# Patient Record
Sex: Female | Born: 1985 | Race: White | Hispanic: No | Marital: Single | Smoking: Former smoker
Health system: Southern US, Community
[De-identification: ages and names within clinical notes are randomized; demographics above are authoritative.]

## PROBLEM LIST (undated history)

## (undated) DIAGNOSIS — J302 Other seasonal allergic rhinitis: Secondary | ICD-10-CM

## (undated) DIAGNOSIS — N390 Urinary tract infection, site not specified: Secondary | ICD-10-CM

## (undated) DIAGNOSIS — B379 Candidiasis, unspecified: Secondary | ICD-10-CM

## (undated) HISTORY — PX: TONSILLECTOMY AND ADENOIDECTOMY: SHX28

## (undated) HISTORY — DX: Urinary tract infection, site not specified: N39.0

## (undated) HISTORY — DX: Candidiasis, unspecified: B37.9

---

## 2011-07-06 ENCOUNTER — Emergency Department: Payer: Self-pay | Admitting: Emergency Medicine

## 2011-07-06 LAB — URINALYSIS, COMPLETE
Ketone: NEGATIVE
Ph: 7 (ref 4.5–8.0)
Protein: NEGATIVE
RBC,UR: 2 /HPF (ref 0–5)
Squamous Epithelial: 14
WBC UR: 8 /HPF (ref 0–5)

## 2011-11-25 ENCOUNTER — Emergency Department: Payer: Self-pay | Admitting: Emergency Medicine

## 2014-03-16 ENCOUNTER — Emergency Department: Payer: Self-pay | Admitting: Emergency Medicine

## 2015-10-13 ENCOUNTER — Encounter: Payer: Self-pay | Admitting: Emergency Medicine

## 2015-10-13 ENCOUNTER — Emergency Department
Admission: EM | Admit: 2015-10-13 | Discharge: 2015-10-13 | Disposition: A | Discharge status: Home / Self Care | Payer: Self-pay | Attending: Emergency Medicine | Admitting: Emergency Medicine

## 2015-10-13 DIAGNOSIS — L02211 Cutaneous abscess of abdominal wall: Secondary | ICD-10-CM | POA: Insufficient documentation

## 2015-10-13 DIAGNOSIS — Z87891 Personal history of nicotine dependence: Secondary | ICD-10-CM | POA: Insufficient documentation

## 2015-10-13 DIAGNOSIS — H6982 Other specified disorders of Eustachian tube, left ear: Secondary | ICD-10-CM

## 2015-10-13 DIAGNOSIS — L0291 Cutaneous abscess, unspecified: Secondary | ICD-10-CM

## 2015-10-13 DIAGNOSIS — H6992 Unspecified Eustachian tube disorder, left ear: Secondary | ICD-10-CM | POA: Insufficient documentation

## 2015-10-13 HISTORY — DX: Other seasonal allergic rhinitis: J30.2

## 2015-10-13 MED ORDER — CETIRIZINE HCL 10 MG PO TABS: 10.0000 mg | ORAL_TABLET | Freq: Every day | ORAL | 0 refills | Status: AC

## 2015-10-13 MED ORDER — LIDOCAINE-EPINEPHRINE (PF) 1 %-1:200000 IJ SOLN
10.0000 mL | Freq: Once | INTRAMUSCULAR | Status: DC
  Filled 2015-10-13: qty 30

## 2015-10-13 MED ORDER — HYDROCODONE-ACETAMINOPHEN 5-325 MG PO TABS
1.0000 | ORAL_TABLET | ORAL | 0 refills | Status: DC | PRN
Start: 2015-10-13 — End: 2016-05-25

## 2015-10-13 MED ORDER — FLUTICASONE PROPIONATE 50 MCG/ACT NA SUSP: 1.0000 | Freq: Two times a day (BID) | NASAL | 0 refills | Status: AC

## 2015-10-13 MED ORDER — SULFAMETHOXAZOLE-TRIMETHOPRIM 800-160 MG PO TABS: 1.0000 | ORAL_TABLET | Freq: Two times a day (BID) | ORAL | 0 refills | Status: DC

## 2015-10-13 NOTE — ED Notes (Signed)
Pt states understanding of discharge instructions. NAD noted at this time.  

## 2015-10-13 NOTE — ED Triage Notes (Addendum)
Pt reports abscess to RLQ of her abdomen that came up last week. Pt states "I didn't have to work this weekend so I thought it would pop on it's own but I have to go to work tomorrow and it hasn't even been this bad before." Pt also reports difficulty hearing out of L ear, denies injury to L ear.

## 2015-10-13 NOTE — ED Provider Notes (Signed)
[facility] Emergency Department Provider Note  ____________________________________________  Time seen: Approximately 4:12 PM  I have reviewed the triage vital signs and the nursing notes.   HISTORY  Chief Complaint Abscess    HPI Rachel Parker is a 30 y.o. female who presents emergency department complaining of abscess to the right lower abdomen and left ear fullness. Patient states that she has had a boil to her right lower abdomen for almost a week. Patient thought it would rupture on its own states it has not. Patient reports redness and swelling to area. No drainage. Pain has been increasing.  Patient also endorses difficulty hearing out of the left ear with a full sensation to ear. Patient does report having seasonal allergies which have been worsening of the past several weeks. Patient denies any fevers or chills, sharp pain, recent swelling, drainage from ear.   Past Medical History:  Diagnosis Date  . Seasonal allergies     There are no active problems to display for this patient.   Past Surgical History:  Procedure Laterality Date  . TONSILLECTOMY AND ADENOIDECTOMY      Prior to Admission medications   Medication Sig Start Date End Date Taking? Authorizing Provider  cetirizine (ZYRTEC) 10 MG tablet Take 1 tablet (10 mg total) by mouth daily. 10/13/15   Delorise Royals Ameshia Pewitt, PA-C  fluticasone (FLONASE) 50 MCG/ACT nasal spray Place 1 spray into both nostrils 2 (two) times daily. 10/13/15   Delorise Royals Rocco Kerkhoff, PA-C  HYDROcodone-acetaminophen (NORCO/VICODIN) 5-325 MG tablet Take 1 tablet by mouth every 4 (four) hours as needed for moderate pain. 10/13/15   Delorise Royals Payeton Germani, PA-C  sulfamethoxazole-trimethoprim (BACTRIM DS,SEPTRA DS) 800-160 MG tablet Take 1 tablet by mouth 2 (two) times daily. 10/13/15   Delorise Royals Heber Hoog, PA-C    Allergies Review of patient's allergies indicates no known allergies.  History reviewed. No pertinent  family history.  Social History Social History  Substance Use Topics  . Smoking status: Former Games developer  . Smokeless tobacco: Never Used  . Alcohol use No     Comment: Occ     Review of Systems  Constitutional: No fever/chills Eyes: No visual changes. No discharge ENT: Is a for left ear fullness and decreased hearing the left ear Cardiovascular: no chest pain. Respiratory: no cough. No SOB. Gastrointestinal: No abdominal pain.  No nausea, no vomiting.  No diarrhea.  No constipation. Musculoskeletal: Negative for musculoskeletal pain. Skin: Positive for "boil" to right lower abdominal Neurological: Negative for headaches, focal weakness or numbness. 10-point ROS otherwise negative.  ____________________________________________   PHYSICAL EXAM:  VITAL SIGNS: ED Triage Vitals  Enc Vitals Group     BP 10/13/15 1531 122/79     Pulse Rate 10/13/15 1531 87     Resp 10/13/15 1531 18     Temp 10/13/15 1531 98.1 F (36.7 C)     Temp Source 10/13/15 1531 Oral     SpO2 10/13/15 1531 95 %     Weight 10/13/15 1527 210 lb (95.3 kg)     Height 10/13/15 1527 5\' 6"  (1.676 m)     Head Circumference --      Peak Flow --      Pain Score 10/13/15 1527 10     Pain Loc --      Pain Edu? --      Excl. in GC? --      Constitutional: Alert and oriented. Well appearing and in no acute distress. Eyes: Conjunctivae are normal. PERRL. EOMI.  Head: Atraumatic. ENT:      Ears: EACs unremarkable bilaterally. TM on right is unremarkable. TM on left is mildly bulging with no air-fluid level.      Nose: Moderate clear congestion/rhinnorhea. Turbinates are boggy.      Mouth/Throat: Mucous membranes are moist.  Neck: No stridor.    Cardiovascular: Normal rate, regular rhythm. Normal S1 and S2.  Good peripheral circulation. Respiratory: Normal respiratory effort without tachypnea or retractions. Lungs CTAB. Good air entry to the bases with no decreased or absent breath sounds. Gastrointestinal:  Bowel sounds 4 quadrants. Soft and nontender to palpation. No guarding or rigidity. No palpable masses. No distention.  Musculoskeletal: Full range of motion to all extremities. No gross deformities appreciated. Neurologic:  Normal speech and language. No gross focal neurologic deficits are appreciated.  Skin:  Skin is warm, dry and intact. No rash noted. Erythematous and edematous skin lesion is sent to the right lower quadrant of the abdominal wall. Area is approximately 10 x 12 cm. Area is showing tender to palpation. Induration and fluctuance is noted in the middle of this lesion. No drainage. Psychiatric: Mood and affect are normal. Speech and behavior are normal. Patient exhibits appropriate insight and judgement.   ____________________________________________   LABS (all labs ordered are listed, but only abnormal results are displayed)  Labs Reviewed - No data to display ____________________________________________  EKG   ____________________________________________  RADIOLOGY   No results found.  ____________________________________________    PROCEDURES  Procedure(s) performed:    Marland KitchenMarland KitchenIncision and Drainage Date/Time: 10/13/2015 5:06 PM Performed by: Gala Romney D Authorized by: Gala Romney D   Consent:    Consent obtained:  Verbal   Consent given by:  Patient   Risks discussed:  Pain Location:    Type:  Abscess   Location:  Trunk   Trunk location:  Abdomen Pre-procedure details:    Skin preparation:  Betadine Procedure type:    Complexity:  Complex Procedure details:    Incision types:  Single straight   Scalpel blade:  11   Wound management:  Probed and deloculated, irrigated with saline and extensive cleaning   Drainage:  Purulent   Drainage amount:  Moderate   Wound treatment:  Wound left open   Packing materials:  1/4 in iodoform gauze Post-procedure details:    Patient tolerance of procedure:  Tolerated well, no immediate  complications      Medications  lidocaine-EPINEPHrine (XYLOCAINE-EPINEPHrine) 1 %-1:200000 (PF) injection 10 mL (not administered)     ____________________________________________   INITIAL IMPRESSION / ASSESSMENT AND PLAN / ED COURSE  Pertinent labs & imaging results that were available during my care of the patient were reviewed by me and considered in my medical decision making (see chart for details).  Review of the [place] CSRS was performed in accordance of the [facility] prior to dispensing any controlled drugs.  Clinical Course    Patient's diagnosis is consistent with Abscess and eustachian tube dysfunction on the left. Abscesses incised and drained as described above. Patient will be placed on antibiotics and limited pain medication for this complaint. Patient will follow-up with urgent care in 2 days for wound recheck and repacking. Patient also has eustachian tube dysfunction on left side likely due to seasonal allergies. Patient is given Flonase and Zyrtec for this complaint. Patient will follow-up with primary care as needed..  Patient is given ED precautions to return to the ED for any worsening or new symptoms.     ____________________________________________  FINAL CLINICAL IMPRESSION(S) /  ED DIAGNOSES  Final diagnoses:  Abscess  Eustachian tube dysfunction, left      NEW MEDICATIONS STARTED DURING THIS VISIT:  New Prescriptions   CETIRIZINE (ZYRTEC) 10 MG TABLET    Take 1 tablet (10 mg total) by mouth daily.   FLUTICASONE (FLONASE) 50 MCG/ACT NASAL SPRAY    Place 1 spray into both nostrils 2 (two) times daily.   HYDROCODONE-ACETAMINOPHEN (NORCO/VICODIN) 5-325 MG TABLET    Take 1 tablet by mouth every 4 (four) hours as needed for moderate pain.   SULFAMETHOXAZOLE-TRIMETHOPRIM (BACTRIM DS,SEPTRA DS) 800-160 MG TABLET    Take 1 tablet by mouth 2 (two) times daily.        This chart was dictated using voice recognition software/Dragon. Despite best efforts to  proofread, errors can occur which can change the meaning. Any change was purely unintentional.    Racheal PatchesJonathan D Markian Glockner, PA-C 10/13/15 1710    Emily FilbertJonathan E Williams, MD 10/13/15 2337

## 2016-05-25 ENCOUNTER — Emergency Department
Admission: EM | Admit: 2016-05-25 | Discharge: 2016-05-25 | Disposition: A | Discharge status: Home / Self Care | Payer: Self-pay | Attending: Emergency Medicine | Admitting: Emergency Medicine

## 2016-05-25 ENCOUNTER — Encounter: Payer: Self-pay | Admitting: Emergency Medicine

## 2016-05-25 DIAGNOSIS — F17210 Nicotine dependence, cigarettes, uncomplicated: Secondary | ICD-10-CM | POA: Insufficient documentation

## 2016-05-25 DIAGNOSIS — L03011 Cellulitis of right finger: Secondary | ICD-10-CM | POA: Insufficient documentation

## 2016-05-25 MED ORDER — CEPHALEXIN 500 MG PO CAPS: 500.0000 mg | ORAL_CAPSULE | Freq: Four times a day (QID) | ORAL | 0 refills | Status: DC

## 2016-05-25 NOTE — ED Notes (Signed)
See triage note  States she noticed some pain to right index finger several days ago  Attempted to cut an ingrown nail  But pain and swelling became worse

## 2016-05-25 NOTE — ED Provider Notes (Signed)
[facility] Emergency Department Provider Note ____________________________________________  Time seen: 9:35 AM  I have reviewed the triage vital signs and the nursing notes.  HISTORY  Chief Complaint  Hand Pain   HPI Rachel Parker is a 31 y.o. female is a complaint of swelling and painful right index finger. Patient states that she thought that her fingernail was "ingrown" and began cutting on the skin.Patient states that it was uncomfortable for about 2 weeks. She states that for the last 4 days her finger has been swollen and painful. Patient states she has been soaking in some warm water in last 24 hours. She is unaware of any fever or chills. Currently she rates her pain as 8 out of 10.    Past Medical History:  Diagnosis Date  . Seasonal allergies     There are no active problems to display for this patient.   Past Surgical History:  Procedure Laterality Date  . TONSILLECTOMY AND ADENOIDECTOMY      Prior to Admission medications   Medication Sig Start Date End Date Taking? Authorizing Provider  cephALEXin (KEFLEX) 500 MG capsule Take 1 capsule (500 mg total) by mouth 4 (four) times daily. 05/25/16   Tommi RumpsSummers, Rhonda L, PA-C  cetirizine (ZYRTEC) 10 MG tablet Take 1 tablet (10 mg total) by mouth daily. 10/13/15   Cuthriell, Delorise RoyalsJonathan D, PA-C  fluticasone (FLONASE) 50 MCG/ACT nasal spray Place 1 spray into both nostrils 2 (two) times daily. 10/13/15   Cuthriell, Delorise RoyalsJonathan D, PA-C    Allergies Patient has no known allergies.  No family history on file.  Social History Social History  Substance Use Topics  . Smoking status: Current Every Day Smoker    Packs/day: 0.50    Types: Cigarettes  . Smokeless tobacco: Never Used  . Alcohol use No     Comment: Occ    Review of Systems  Constitutional: Negative for fever. Cardiovascular: Negative for chest pain. Respiratory: Negative for shortness of breath. Musculoskeletal:Positive for right index  finger pain. Skin: Positive for soft tissue swelling right index finger. Neurological: Negative for headaches, focal weakness or numbness. ____________________________________________  PHYSICAL EXAM:  VITAL SIGNS: ED Triage Vitals  Enc Vitals Group     BP 05/25/16 0836 131/76     Pulse Rate 05/25/16 0836 80     Resp 05/25/16 0836 20     Temp 05/25/16 0836 98.3 F (36.8 C)     Temp Source 05/25/16 0836 Oral     SpO2 05/25/16 0836 96 %     Weight 05/25/16 0837 225 lb (102.1 kg)     Height 05/25/16 0837 5\' 5"  (1.651 m)     Head Circumference --      Peak Flow --      Pain Score 05/25/16 0836 8     Pain Loc --      Pain Edu? --      Excl. in GC? --     Constitutional: Alert and oriented. Well appearing and in no distress. Neck: No stridor Cardiovascular: Normal rate, regular rhythm. Normal distal pulses. Respiratory: Normal respiratory effort. Musculoskeletal: On examination of the right index finger there is moderate soft tissue edema noted at the base of the nail. There is no fluctuant area for drainage. Moderate tenderness on palpation. Motor sensory function intact. Neurologic:  Normal speech and language. No gross focal neurologic deficits are appreciated. Skin:  Skin is warm, dry and intact. Skin as above. Psychiatric: Mood and affect are normal. Patient exhibits appropriate  insight and judgment. ____________________________________________   INITIAL IMPRESSION / ASSESSMENT AND PLAN / ED COURSE  Patient was placed on Keflex 500 mg 4 times a day for 7 days. Patient is continue warm soaks or compresses to her finger. She is follow-up with her primary care doctor if any continued problems. She is also encouraged not to cut the skin around her nail in the future.    ____________________________________________  FINAL CLINICAL IMPRESSION(S) / ED DIAGNOSES  Final diagnoses:  Paronychia of right index finger     Tommi Rumps, PA-C 05/25/16 1123    Emily Filbert, MD 05/25/16 1128

## 2016-05-25 NOTE — Discharge Instructions (Signed)
Began taking Keflex 4 times a day for 7 days. Also continue soaking in warm water 3-4 times a day. Tylenol or ibuprofen as needed for pain. Follow up with your primary care doctor if any continued problems. Do not cut in the skin around your nail.

## 2016-05-25 NOTE — ED Triage Notes (Signed)
States R index finger pain x 2 weeks, swollen x 4 days.

## 2018-04-28 ENCOUNTER — Encounter: Payer: Self-pay | Admitting: *Deleted

## 2018-04-28 ENCOUNTER — Other Ambulatory Visit: Payer: Self-pay

## 2018-04-28 ENCOUNTER — Emergency Department: Payer: 59

## 2018-04-28 ENCOUNTER — Emergency Department
Admission: EM | Admit: 2018-04-28 | Discharge: 2018-04-28 | Disposition: A | Discharge status: Home / Self Care | Payer: 59 | Attending: Student in an Organized Health Care Education/Training Program | Admitting: Student in an Organized Health Care Education/Training Program

## 2018-04-28 DIAGNOSIS — F17210 Nicotine dependence, cigarettes, uncomplicated: Secondary | ICD-10-CM | POA: Insufficient documentation

## 2018-04-28 DIAGNOSIS — O208 Other hemorrhage in early pregnancy: Secondary | ICD-10-CM | POA: Insufficient documentation

## 2018-04-28 DIAGNOSIS — N939 Abnormal uterine and vaginal bleeding, unspecified: Secondary | ICD-10-CM

## 2018-04-28 DIAGNOSIS — Z3A11 11 weeks gestation of pregnancy: Secondary | ICD-10-CM | POA: Diagnosis not present

## 2018-04-28 DIAGNOSIS — O99331 Smoking (tobacco) complicating pregnancy, first trimester: Secondary | ICD-10-CM | POA: Insufficient documentation

## 2018-04-28 DIAGNOSIS — R102 Pelvic and perineal pain: Secondary | ICD-10-CM | POA: Insufficient documentation

## 2018-04-28 LAB — CBC WITH DIFFERENTIAL/PLATELET
Abs Immature Granulocytes: 0.02 10*3/uL (ref 0.00–0.07)
Basophils Absolute: 0.1 10*3/uL (ref 0.0–0.1)
Basophils Relative: 1 %
Eosinophils Absolute: 0.5 10*3/uL (ref 0.0–0.5)
Eosinophils Relative: 5 %
HCT: 38.8 % (ref 36.0–46.0)
Hemoglobin: 13.2 g/dL (ref 12.0–15.0)
Immature Granulocytes: 0 %
Lymphocytes Relative: 26 %
Lymphs Abs: 2.7 10*3/uL (ref 0.7–4.0)
MCH: 31 pg (ref 26.0–34.0)
MCHC: 34 g/dL (ref 30.0–36.0)
MCV: 91.1 fL (ref 80.0–100.0)
Monocytes Absolute: 0.5 10*3/uL (ref 0.1–1.0)
Monocytes Relative: 5 %
Neutro Abs: 6.7 10*3/uL (ref 1.7–7.7)
Neutrophils Relative %: 63 %
Platelets: 271 10*3/uL (ref 150–400)
RBC: 4.26 MIL/uL (ref 3.87–5.11)
RDW: 12.2 % (ref 11.5–15.5)
WBC: 10.4 10*3/uL (ref 4.0–10.5)
nRBC: 0 % (ref 0.0–0.2)

## 2018-04-28 LAB — ABO/RH: ABO/RH(D): B POS

## 2018-04-28 LAB — HCG, QUANTITATIVE, PREGNANCY: hCG, Beta Chain, Quant, S: 5792 m[IU]/mL — ABNORMAL HIGH (ref <5)

## 2018-04-28 NOTE — ED Triage Notes (Signed)
Pt reports she is [redacted] weeks pregnant.  Pt states she woke up from a nap and has abd cramping and vag bleeding.  Pt alert.

## 2018-04-28 NOTE — ED Provider Notes (Signed)
[facility] Emergency Department Provider Note  ____________________________________________   None    (approximate)  I have reviewed the triage vital signs and the nursing notes.   HISTORY  Chief Complaint Vaginal Bleeding    HPI Rachel Parker is a 33 y.o. female presents emergency department complaining of abdominal cramping and vaginal bleeding.  Patient reports she is [redacted] weeks pregnant.  She denies any fever or chills.  She states that the bleeding was very light at first but now she is soaked through a paper towel that she put in her pants.  She was supposed to [facility].    Past Medical History:  Diagnosis Date  . Seasonal allergies     There are no active problems to display for this patient.   Past Surgical History:  Procedure Laterality Date  . TONSILLECTOMY AND ADENOIDECTOMY      Prior to Admission medications   Medication Sig Start Date End Date Taking? Authorizing Provider  cephALEXin (KEFLEX) 500 MG capsule Take 1 capsule (500 mg total) by mouth 4 (four) times daily. 05/25/16   Tommi RumpsSummers, Rhonda L, PA-C  cetirizine (ZYRTEC) 10 MG tablet Take 1 tablet (10 mg total) by mouth daily. 10/13/15   Cuthriell, Delorise RoyalsJonathan D, PA-C  fluticasone (FLONASE) 50 MCG/ACT nasal spray Place 1 spray into both nostrils 2 (two) times daily. 10/13/15   Cuthriell, Delorise RoyalsJonathan D, PA-C    Allergies Patient has no known allergies.  No family history on file.  Social History Social History   Tobacco Use  . Smoking status: Current Every Day Smoker    Packs/day: 0.50    Types: Cigarettes  . Smokeless tobacco: Never Used  Substance Use Topics  . Alcohol use: No    Comment: Occ  . Drug use: Yes    Types: Marijuana    Comment: 1x a day    Review of Systems  Constitutional: No fever/chills Eyes: No visual changes. ENT: No sore throat. Respiratory: Denies cough Gastrointestinal: Positive for lower abdominal pain and cramping  Genitourinary: Negative for dysuria.  Positive vaginal bleeding Musculoskeletal: Negative for back pain. Skin: Negative for rash.    ____________________________________________   PHYSICAL EXAM:  VITAL SIGNS: ED Triage Vitals  Enc Vitals Group     BP 04/28/18 1910 (!) 144/78     Pulse Rate 04/28/18 1910 91     Resp 04/28/18 1910 20     Temp 04/28/18 1910 97.9 F (36.6 C)     Temp Source 04/28/18 1910 Oral     SpO2 04/28/18 1910 99 %     Weight --      Height --      Head Circumference --      Peak Flow --      Pain Score 04/28/18 1911 7     Pain Loc --      Pain Edu? --      Excl. in GC? --     Constitutional: Alert and oriented. Well appearing and in no acute distress. Eyes: Conjunctivae are normal.  Head: Atraumatic. Nose: No congestion/rhinnorhea. Mouth/Throat: Mucous membranes are moist.   Neck:  supple no lymphadenopathy noted Cardiovascular: Normal rate, regular rhythm. Heart sounds are normal Respiratory: Normal respiratory effort.  No retractions, lungs c t a  Abd: soft tender in the left lower quadrant, Bs normal all 4 quad GU: deferred Musculoskeletal: FROM all extremities, warm and well perfused Neurologic:  Normal speech and language.  Skin:  Skin is warm, dry  and intact. No rash noted. Psychiatric: Mood and affect are normal. Speech and behavior are normal.  ____________________________________________   LABS (all labs ordered are listed, but only abnormal results are displayed)  Labs Reviewed  CBC WITH DIFFERENTIAL/PLATELET  HCG, QUANTITATIVE, PREGNANCY  ABO/RH   ____________________________________________   ____________________________________________  RADIOLOGY  Ultrasound OB less than 14 weeks  ____________________________________________   PROCEDURES  Procedure(s) performed: No  Procedures    ____________________________________________   INITIAL IMPRESSION / ASSESSMENT AND PLAN / ED COURSE  Pertinent labs & imaging  results that were available during my care of the patient were reviewed by me and considered in my medical decision making (see chart for details).   The patient is a 33 year old female presents emergency department vaginal bleeding.  Patient is [redacted] weeks pregnant.  She also has cramping in the pelvic area.  Physical exam shows a left lower quadrant be tender.  Remainder is unremarkable.  Labs for beta-hCG, CBC, and ABO were ordered.  Report was given to Dr. Roxan Hockey.     As part of my medical decision making, I reviewed the following data within the electronic MEDICAL RECORD NUMBER Nursing notes reviewed and incorporated, Labs reviewed CBC is normal, ABO Rh is B+, Old chart reviewed, Evaluated by EM attending Dr. Roxan Hockey, Notes from prior ED visits and [place] Controlled Substance Database  ____________________________________________   FINAL CLINICAL IMPRESSION(S) / ED DIAGNOSES  Final diagnoses:  Vaginal bleeding      NEW MEDICATIONS STARTED DURING THIS VISIT:  New Prescriptions   No medications on file     Note:  This document was prepared using Dragon voice recognition software and may include unintentional dictation errors.    Faythe Ghee, PA-C 04/28/18 2041    Willy Eddy, MD 04/28/18 2215

## 2018-04-29 ENCOUNTER — Emergency Department: Payer: 59 | Admitting: Anesthesiology

## 2018-04-29 ENCOUNTER — Emergency Department
Admission: EM | Admit: 2018-04-29 | Discharge: 2018-04-29 | Disposition: A | Discharge status: Home / Self Care | Payer: 59 | Attending: Obstetrics and Gynecology | Admitting: Obstetrics and Gynecology

## 2018-04-29 ENCOUNTER — Encounter
Admission: EM | Disposition: A | Discharge status: Home / Self Care | Payer: Self-pay | Source: Home / Self Care | Attending: Emergency Medicine

## 2018-04-29 ENCOUNTER — Emergency Department: Payer: 59

## 2018-04-29 ENCOUNTER — Other Ambulatory Visit: Payer: Self-pay

## 2018-04-29 ENCOUNTER — Encounter: Payer: Self-pay | Admitting: Emergency Medicine

## 2018-04-29 ENCOUNTER — Encounter: Payer: Self-pay | Admitting: Maternal Newborn

## 2018-04-29 DIAGNOSIS — O99511 Diseases of the respiratory system complicating pregnancy, first trimester: Secondary | ICD-10-CM | POA: Diagnosis not present

## 2018-04-29 DIAGNOSIS — O99331 Smoking (tobacco) complicating pregnancy, first trimester: Secondary | ICD-10-CM | POA: Diagnosis not present

## 2018-04-29 DIAGNOSIS — O034 Incomplete spontaneous abortion without complication: Secondary | ICD-10-CM

## 2018-04-29 DIAGNOSIS — F17210 Nicotine dependence, cigarettes, uncomplicated: Secondary | ICD-10-CM | POA: Insufficient documentation

## 2018-04-29 DIAGNOSIS — J302 Other seasonal allergic rhinitis: Secondary | ICD-10-CM | POA: Insufficient documentation

## 2018-04-29 DIAGNOSIS — Z7951 Long term (current) use of inhaled steroids: Secondary | ICD-10-CM | POA: Diagnosis not present

## 2018-04-29 DIAGNOSIS — O021 Missed abortion: Secondary | ICD-10-CM | POA: Insufficient documentation

## 2018-04-29 DIAGNOSIS — O4690 Antepartum hemorrhage, unspecified, unspecified trimester: Secondary | ICD-10-CM

## 2018-04-29 HISTORY — PX: DILATION AND EVACUATION: SHX1459

## 2018-04-29 SURGERY — DILATION AND EVACUATION, UTERUS
Anesthesia: General

## 2018-04-29 MED ORDER — FENTANYL CITRATE (PF) 100 MCG/2ML IJ SOLN: 25.0000 ug | INTRAMUSCULAR | Status: DC | PRN

## 2018-04-29 MED ORDER — CELECOXIB 200 MG PO CAPS
400.0000 mg | ORAL_CAPSULE | ORAL | Status: AC
  Administered 2018-04-29: 400 mg via ORAL

## 2018-04-29 MED ORDER — LACTATED RINGERS IV SOLN
INTRAVENOUS | Status: DC
  Administered 2018-04-29: 10:00:00 via INTRAVENOUS

## 2018-04-29 MED ORDER — GABAPENTIN 300 MG PO CAPS
300.0000 mg | ORAL_CAPSULE | ORAL | Status: AC
  Administered 2018-04-29: 300 mg via ORAL

## 2018-04-29 MED ORDER — ACETAMINOPHEN 500 MG PO TABS
1000.0000 mg | ORAL_TABLET | ORAL | Status: AC
  Administered 2018-04-29: 1000 mg via ORAL

## 2018-04-29 MED ORDER — LIDOCAINE HCL (PF) 2 % IJ SOLN
INTRAMUSCULAR | Status: AC
  Filled 2018-04-29: qty 10

## 2018-04-29 MED ORDER — ACETAMINOPHEN 500 MG PO TABS
ORAL_TABLET | ORAL | Status: AC
  Filled 2018-04-29: qty 2

## 2018-04-29 MED ORDER — PROPOFOL 10 MG/ML IV BOLUS
INTRAVENOUS | Status: AC
  Filled 2018-04-29: qty 20

## 2018-04-29 MED ORDER — PROMETHAZINE HCL 25 MG/ML IJ SOLN: 6.2500 mg | INTRAMUSCULAR | Status: DC | PRN

## 2018-04-29 MED ORDER — MIDAZOLAM HCL 2 MG/2ML IJ SOLN
INTRAMUSCULAR | Status: AC
  Filled 2018-04-29: qty 2

## 2018-04-29 MED ORDER — MORPHINE SULFATE (PF) 4 MG/ML IV SOLN
4.0000 mg | Freq: Once | INTRAVENOUS | Status: AC
  Administered 2018-04-29: 4 mg via INTRAVENOUS

## 2018-04-29 MED ORDER — LACTATED RINGERS IV SOLN: INTRAVENOUS | Status: DC

## 2018-04-29 MED ORDER — ONDANSETRON HCL 4 MG/2ML IJ SOLN
INTRAMUSCULAR | Status: DC | PRN
  Administered 2018-04-29: 4 mg via INTRAVENOUS

## 2018-04-29 MED ORDER — DOXYCYCLINE HYCLATE 100 MG PO TABS
100.0000 mg | ORAL_TABLET | Freq: Once | ORAL | Status: AC
  Administered 2018-04-29: 100 mg via ORAL
  Filled 2018-04-29 (×2): qty 1

## 2018-04-29 MED ORDER — LIDOCAINE HCL (CARDIAC) PF 100 MG/5ML IV SOSY
PREFILLED_SYRINGE | INTRAVENOUS | Status: DC | PRN
  Administered 2018-04-29: 60 mg via INTRAVENOUS

## 2018-04-29 MED ORDER — MORPHINE SULFATE (PF) 4 MG/ML IV SOLN
4.0000 mg | Freq: Once | INTRAVENOUS | Status: AC
  Administered 2018-04-29: 4 mg via INTRAVENOUS
  Filled 2018-04-29: qty 1

## 2018-04-29 MED ORDER — MIDAZOLAM HCL 2 MG/2ML IJ SOLN
INTRAMUSCULAR | Status: DC | PRN
  Administered 2018-04-29: 2 mg via INTRAVENOUS

## 2018-04-29 MED ORDER — FENTANYL CITRATE (PF) 100 MCG/2ML IJ SOLN
INTRAMUSCULAR | Status: DC | PRN
  Administered 2018-04-29 (×4): 25 ug via INTRAVENOUS

## 2018-04-29 MED ORDER — OXYCODONE-ACETAMINOPHEN 10-325 MG PO TABS: 1.0000 | ORAL_TABLET | Freq: Four times a day (QID) | ORAL | 0 refills | Status: DC | PRN

## 2018-04-29 MED ORDER — CELECOXIB 200 MG PO CAPS
ORAL_CAPSULE | ORAL | Status: AC
  Filled 2018-04-29: qty 2

## 2018-04-29 MED ORDER — DEXMEDETOMIDINE HCL IN NACL 80 MCG/20ML IV SOLN
INTRAVENOUS | Status: AC
  Filled 2018-04-29: qty 20

## 2018-04-29 MED ORDER — PROPOFOL 10 MG/ML IV BOLUS
INTRAVENOUS | Status: DC | PRN
  Administered 2018-04-29: 80 mg via INTRAVENOUS

## 2018-04-29 MED ORDER — FENTANYL CITRATE (PF) 100 MCG/2ML IJ SOLN
INTRAMUSCULAR | Status: AC
  Filled 2018-04-29: qty 2

## 2018-04-29 MED ORDER — BUPIVACAINE-EPINEPHRINE (PF) 0.5% -1:200000 IJ SOLN
INTRAMUSCULAR | Status: DC | PRN
  Administered 2018-04-29: 20 mL

## 2018-04-29 MED ORDER — ONDANSETRON HCL 4 MG/2ML IJ SOLN
4.0000 mg | Freq: Once | INTRAMUSCULAR | Status: AC
  Administered 2018-04-29: 4 mg via INTRAVENOUS

## 2018-04-29 MED ORDER — MORPHINE SULFATE (PF) 2 MG/ML IV SOLN
INTRAVENOUS | Status: AC
  Filled 2018-04-29: qty 1

## 2018-04-29 MED ORDER — MORPHINE SULFATE (PF) 4 MG/ML IV SOLN
4.0000 mg | Freq: Once | INTRAVENOUS | Status: AC
Start: 2018-04-29 — End: 2018-04-29
  Administered 2018-04-29: 4 mg via INTRAVENOUS

## 2018-04-29 MED ORDER — MORPHINE SULFATE (PF) 4 MG/ML IV SOLN
INTRAVENOUS | Status: AC
  Administered 2018-04-29: 4 mg via INTRAVENOUS
  Filled 2018-04-29: qty 1

## 2018-04-29 MED ORDER — DEXMEDETOMIDINE HCL 200 MCG/2ML IV SOLN
INTRAVENOUS | Status: DC | PRN
  Administered 2018-04-29 (×3): 12 ug via INTRAVENOUS

## 2018-04-29 MED ORDER — PROPOFOL 500 MG/50ML IV EMUL
INTRAVENOUS | Status: DC | PRN
  Administered 2018-04-29: 100 ug/kg/min via INTRAVENOUS

## 2018-04-29 MED ORDER — GABAPENTIN 300 MG PO CAPS
ORAL_CAPSULE | ORAL | Status: AC
  Filled 2018-04-29: qty 1

## 2018-04-29 MED ORDER — ONDANSETRON HCL 4 MG/2ML IJ SOLN
INTRAMUSCULAR | Status: AC
  Administered 2018-04-29: 4 mg via INTRAVENOUS
  Filled 2018-04-29: qty 2

## 2018-04-29 SURGICAL SUPPLY — 24 items
CATH ROBINSON RED A/P 16FR (CATHETERS) ×3 IMPLANT
COVER WAND RF STERILE (DRAPES) ×3 IMPLANT
FILTER UTR ASPR SPEC (MISCELLANEOUS) ×1 IMPLANT
FLTR UTR ASPR SPEC (MISCELLANEOUS) ×3
GLOVE BIO SURGEON STRL SZ7 (GLOVE) ×7 IMPLANT
GLOVE INDICATOR 7.5 STRL GRN (GLOVE) ×7 IMPLANT
GOWN STRL REUS W/ TWL LRG LVL3 (GOWN DISPOSABLE) ×2 IMPLANT
GOWN STRL REUS W/TWL LRG LVL3 (GOWN DISPOSABLE) ×6
KIT BERKELEY 1ST TRIMESTER 3/8 (MISCELLANEOUS) ×3 IMPLANT
KIT TURNOVER CYSTO (KITS) ×3 IMPLANT
NDL SPNL 20GX3.5 QUINCKE YW (NEEDLE) IMPLANT
NEEDLE SPNL 20GX3.5 QUINCKE YW (NEEDLE) ×3 IMPLANT
PACK DNC HYST (MISCELLANEOUS) ×3 IMPLANT
PAD OB MATERNITY 4.3X12.25 (PERSONAL CARE ITEMS) ×3 IMPLANT
PAD PREP 24X41 OB/GYN DISP (PERSONAL CARE ITEMS) ×3 IMPLANT
SET BERKELEY SUCTION TUBING (SUCTIONS) ×3 IMPLANT
SYR CONTROL 10ML (SYRINGE) ×2 IMPLANT
TOWEL OR 17X26 4PK STRL BLUE (TOWEL DISPOSABLE) ×3 IMPLANT
VACURETTE 10 RIGID CVD (CANNULA) IMPLANT
VACURETTE 6 ASPIR F TIP BERK (CANNULA) IMPLANT
VACURETTE 7MM F TIP (CANNULA)
VACURETTE 7MM F TIP STRL (CANNULA) IMPLANT
VACURETTE 8 RIGID CVD (CANNULA) IMPLANT
VACURETTE 8MM F TIP (MISCELLANEOUS) ×3 IMPLANT

## 2018-04-29 NOTE — ED Triage Notes (Signed)
Patient ambulatory to triage with steady gait, without difficulty, appears uncomfortable; st seen earlier for miscarriage, approx 11wks pregnancy: G2P1; having increased vag bleeding and pain

## 2018-04-29 NOTE — ED Provider Notes (Signed)
[facility] Emergency Department Provider Note    First MD Initiated Contact with Patient 04/29/18 0406     (approximate)  I have reviewed the triage vital signs and the nursing notes.   HISTORY  Chief Complaint Vaginal Bleeding   HPI Rachel Parker is a 33 y.o. female with medical history as listed below G2, P1 proximately [redacted] weeks pregnant diagnosed with fetal demise last night here in this emergency department turned to the emergency department secondary to worsening vaginal bleeding and pelvic discomfort.  Patient states that current pain score is 10 out of 10.        Past Medical History:  Diagnosis Date  . Seasonal allergies     There are no active problems to display for this patient.   Past Surgical History:  Procedure Laterality Date  . TONSILLECTOMY AND ADENOIDECTOMY      Prior to Admission medications   Medication Sig Start Date End Date Taking? Authorizing Provider  cephALEXin (KEFLEX) 500 MG capsule Take 1 capsule (500 mg total) by mouth 4 (four) times daily. 05/25/16   Tommi Rumps, PA-C  cetirizine (ZYRTEC) 10 MG tablet Take 1 tablet (10 mg total) by mouth daily. 10/13/15   Cuthriell, Delorise Royals, PA-C  fluticasone (FLONASE) 50 MCG/ACT nasal spray Place 1 spray into both nostrils 2 (two) times daily. 10/13/15   Cuthriell, Delorise Royals, PA-C    Allergies Patient has no known allergies.  No family history on file.  Social History Social History   Tobacco Use  . Smoking status: Current Every Day Smoker    Packs/day: 0.50    Types: Cigarettes  . Smokeless tobacco: Never Used  Substance Use Topics  . Alcohol use: No    Comment: Occ  . Drug use: Yes    Types: Marijuana    Comment: 1x a day    Review of Systems Constitutional: No fever/chills Eyes: No visual changes. ENT: No sore throat. Cardiovascular: Denies chest pain. Respiratory: Denies shortness of breath. Gastrointestinal: No abdominal pain.  No nausea, no  vomiting.  No diarrhea.  No constipation. Genitourinary: Positive for pelvic pain and vaginal bleeding Musculoskeletal: Negative for neck pain.  Negative for back pain. Integumentary: Negative for rash. Neurological: Negative for headaches, focal weakness or numbness.   ____________________________________________   PHYSICAL EXAM:  VITAL SIGNS: ED Triage Vitals  Enc Vitals Group     BP 04/29/18 0407 126/87     Pulse Rate 04/29/18 0407 90     Resp 04/29/18 0408 (!) 24     Temp 04/29/18 0408 97.6 F (36.4 C)     Temp Source 04/29/18 0408 Oral     SpO2 04/29/18 0407 100 %     Weight 04/29/18 0401 97.5 kg (215 lb)     Height 04/29/18 0401 1.651 m (5\' 5" )     Head Circumference --      Peak Flow --      Pain Score 04/29/18 0401 10     Pain Loc --      Pain Edu? --      Excl. in GC? --     Constitutional: Alert and oriented.  Parent discomfort  eyes: Conjunctivae are normal.  Mouth/Throat: Mucous membranes are moist.  Oropharynx non-erythematous. Neck: No stridor.   Cardiovascular: Normal rate, regular rhythm. Good peripheral circulation. Grossly normal heart sounds. Respiratory: Normal respiratory effort.  No retractions. No audible wheezing. Gastrointestinal: Soft and nontender. No distention.  Genitourinary: Mild to moderate vaginal bleeding with clot  noted in the cervical office however no fetal tissue noted Musculoskeletal: No lower extremity tenderness nor edema. No gross deformities of extremities. Neurologic:  Normal speech and language. No gross focal neurologic deficits are appreciated.  Skin:  Skin is warm, dry and intact. No rash noted. Psychiatric: Mood and affect are normal. Speech and behavior are normal.  ________  RADIOLOGY I,  N Mattisen Pohlmann, personally viewed and evaluated these images (plain radiographs) as part of my medical decision making, as well as reviewing the written report by the radiologist.  ED MD interpretation: Continued small fetal pole  with no cardiac activity identified on ultrasound with an irregular shaped gestational sac.  Official radiology report(s): US Ob Comp Less 14 Wks  Result Date: 04/29/2018 CLINICAL DATA:  33 year old female with vaginal bleeding in the 1st trimester of pregnancy. Estimated gestational age by LMP 11 weeks and 6 days, quantitative beta HCG 5,792. EXAM: OBSTETRIC <14 WK [place] AND TRANSVAGINAL OB US TECHNIQUE: Both transabdominal and transvaginal ultrasound examinations were performed for complete evaluation of the gestation as well as the maternal uterus, adnexal regions, and pelvic cul-de-sac. Transvaginal technique was performed to assess early pregnancy. COMPARISON:  Ob ultrasound yesterday. FINDINGS: Intrauterine gestational sac: Single, elongated and irregular shaped (image 32) Yolk sac:  Not visible Embryo:  Detected Cardiac Activity: Not detected MSD: 21.1 mm CRL:  6.6 mm   6 w   4 d Subchorionic hemorrhage:  None visualized. Maternal uterus/adnexae: Right ovary probably corpus luteum redemonstrated (image 65). The right ovary today is 4.3 x 2.8 by 2.7 centimeters. Normal left ovary 4.3 x 2.4 by 2.6 centimeters. Trace free fluid in the cul-de-sac. IMPRESSION: 1. Continued small fetal pole with no cardiac activity identified. Irregular shaped gestational sac. The findings remain most compatible with fetal demise (Diagnostic Criteria for Nonviable Pregnancy Early in the First Trimester. Malva Limes Med [phone]). 2. No subchorionic hemorrhage identified today and only trace free fluid in the pelvis. The ovaries are within normal limits, corpus luteum suspected on the right. Electronically Signed   By: Odessa Fleming M.D.   On: 04/29/2018 06:38   US Ob Transvaginal  Result Date: 04/29/2018 CLINICAL DATA:  33 year old female with vaginal bleeding in the 1st trimester of pregnancy. Estimated gestational age by LMP 11 weeks and 6 days, quantitative beta HCG 5,792. EXAM: OBSTETRIC <14 WK [place] AND TRANSVAGINAL OB US  TECHNIQUE: Both transabdominal and transvaginal ultrasound examinations were performed for complete evaluation of the gestation as well as the maternal uterus, adnexal regions, and pelvic cul-de-sac. Transvaginal technique was performed to assess early pregnancy. COMPARISON:  Ob ultrasound yesterday. FINDINGS: Intrauterine gestational sac: Single, elongated and irregular shaped (image 32) Yolk sac:  Not visible Embryo:  Detected Cardiac Activity: Not detected MSD: 21.1 mm CRL:  6.6 mm   6 w   4 d Subchorionic hemorrhage:  None visualized. Maternal uterus/adnexae: Right ovary probably corpus luteum redemonstrated (image 65). The right ovary today is 4.3 x 2.8 by 2.7 centimeters. Normal left ovary 4.3 x 2.4 by 2.6 centimeters. Trace free fluid in the cul-de-sac. IMPRESSION: 1. Continued small fetal pole with no cardiac activity identified. Irregular shaped gestational sac. The findings remain most compatible with fetal demise (Diagnostic Criteria for Nonviable Pregnancy Early in the First Trimester. Malva Limes Med [phone]). 2. No subchorionic hemorrhage identified today and only trace free fluid in the pelvis. The ovaries are within normal limits, corpus luteum suspected on the right. Electronically Signed   By: Althea Grimmer.D.  On: 04/29/2018 06:38   [place] Ob Less Than 14 Weeks With Ob Transvaginal  Result Date: 04/28/2018 CLINICAL DATA:  Vaginal bleeding for 1 day EXAM: OBSTETRIC <14 WK US AND TRANSVAGINAL OB US TECHNIQUE: Both transabdominal and transvaginal ultrasound examinations were performed for complete evaluation of the gestation as well as the maternal uterus, adnexal regions, and pelvic cul-de-sac. Transvaginal technique was performed to assess early pregnancy. COMPARISON:  None. FINDINGS: Intrauterine gestational sac: Single Yolk sac:  Not Visualized. Embryo:  Visualized. Cardiac Activity: Not Visualized. CRL: 7 mm   6 w   4 d                  US EDC: 12/18/2018 Subchorionic hemorrhage:  Small  subchorionic hemorrhage Maternal uterus/adnexae: Ovaries are within normal limits. Right ovary measures 4.5 x 3.1 by 2.7 cm and contains a corpus luteum. The left ovary measures 4 x 3.1 x 3.3 cm. No significant free fluid IMPRESSION: 1. Single intrauterine pregnancy with visible embryo. Average crown-rump length of 7 mm but no fetal cardiac activity. Findings meet definitive criteria for failed pregnancy. This follows SRU consensus guidelines: Diagnostic Criteria for Nonviable Pregnancy Early in the First Trimester. Macy Mis Engl J Med [phone];369:1443-51. 2. Small subchorionic hemorrhage Electronically Signed   By: Jasmine PangKim  Fujinaga M.D.   On: 04/28/2018 21:48     Procedures   ____________________________________________   INITIAL IMPRESSION / MDM / ASSESSMENT AND PLAN / ED COURSE  As part of my medical decision making, I reviewed the following data within the electronic MEDICAL RECORD NUMBER   33 year old female presenting to the emergency department with history and physical exam of impending miscarriage.  Ultrasound consistent with ultrasound that was performed earlier last night.  Patient in considerable distress and as such multiple doses of IV morphine administered without pain improvement.  Patient discussed with Dr. Elesa MassedWard OB/GYN who will evaluate the patient in the emergency department.  Rachel Parker was evaluated in Emergency Department on 04/29/2018 for the symptoms described in the history of present illness. She was evaluated in the context of the global COVID-19 pandemic, which necessitated consideration that the patient might be at risk for infection with the SARS-CoV-2 virus that causes COVID-19. Institutional protocols and algorithms that pertain to the evaluation of patients at risk for COVID-19 are in a state of rapid change based on information released by regulatory bodies including the CDC and federal and state organizations. These policies and algorithms were followed during the patient's care in  the ED.    ____________________________________________  FINAL CLINICAL IMPRESSION(S) / ED DIAGNOSES  Final diagnoses:  Incomplete miscarriage     MEDICATIONS GIVEN DURING THIS VISIT:  Medications  morphine 2 MG/ML injection (has no administration in time range)  morphine 2 MG/ML injection (has no administration in time range)  morphine 4 MG/ML injection 4 mg (4 mg Intravenous Given 04/29/18 0421)  ondansetron (ZOFRAN) injection 4 mg (4 mg Intravenous Given 04/29/18 0421)  morphine 4 MG/ML injection 4 mg (4 mg Intravenous Given 04/29/18 0446)  morphine 4 MG/ML injection 4 mg (4 mg Intravenous Given 04/29/18 [id number])     ED Discharge Orders    None       Note:  This document was prepared using Dragon voice recognition software and may include unintentional dictation errors.   Darci CurrentBrown, Buckley N, MD 04/29/18 [phone]

## 2018-04-29 NOTE — ED Notes (Signed)
Pt awaiting US

## 2018-04-29 NOTE — Transfer of Care (Signed)
Immediate Anesthesia Transfer of Care Note  Patient: Rachel Parker  Procedure(s) Performed: DILATATION AND EVACUATION (N/A )  Patient Location: PACU  Anesthesia Type:General  Level of Consciousness: awake, alert  and oriented  Airway & Oxygen Therapy: Patient Spontanous Breathing and Patient connected to face mask oxygen  Post-op Assessment: Report given to RN and Post -op Vital signs reviewed and stable  Post vital signs: Reviewed and stable  Last Vitals:  Vitals Value Taken Time  BP 119/70 04/29/2018 11:25 AM  Temp 36.1 C 04/29/2018 11:25 AM  Pulse 83 04/29/2018 11:27 AM  Resp 18 04/29/2018 11:27 AM  SpO2 96 % 04/29/2018 11:27 AM  Vitals shown include unvalidated device data.  Last Pain:  Vitals:   04/29/18 1125  TempSrc:   PainSc: 0-No pain         Complications: No apparent anesthesia complications

## 2018-04-29 NOTE — ED Notes (Signed)
Patient transported to OR, will see OBGYN in preop. PT explained that no procedure will take place until seen by physician. PT verbalizes understanding.

## 2018-04-29 NOTE — Anesthesia Post-op Follow-up Note (Signed)
Anesthesia QCDR form completed.        

## 2018-04-29 NOTE — Consult Note (Signed)
Consult History and Physical   SERVICE: Gynecology   Patient Name: Rachel Parker Patient MRN:   [id number]  CC: Heavy vaginal bleeding and severe pain  HPI: Rachel Parker is a 33 y.o. G2P1001 at 11wks by LMP by 6wk by ultrasound today with severe pain and bleeding. No cardiac activity.   She has required 4mg  morphine an hour for the last 3 hrs and is still significantly distressed during contractions on my exam. No peritoneal signs.  Review of Systems: positives in bold GEN:   fevers, chills, weight changes, appetite changes, fatigue, night sweats HEENT:  HA, vision changes, hearing loss, congestion, rhinorrhea, sinus pressure, dysphagia CV:   CP, palpitations PULM:  SOB, cough GI:  abd pain, N/V/D/C, vaginal bleeding GU:  dysuria, urgency, frequency MSK:  arthralgias, myalgias, back pain, swelling SKIN:  rashes, color changes, pallor NEURO:  numbness, weakness, tingling, seizures, dizziness, tremors PSYCH:  depression, anxiety, behavioral problems, confusion  HEME/LYMPH:  easy bruising or bleeding ENDO:  heat/cold intolerance  Past Obstetrical History: OB History    Gravida  1   Para      Term      Preterm      AB      Living        SAB      TAB      Ectopic      Multiple      Live Births              Past Gynecologic History: Patient's last menstrual period was 02/05/2018 (exact date).   Past Medical History: Past Medical History:  Diagnosis Date  . Seasonal allergies     Past Surgical History:   Past Surgical History:  Procedure Laterality Date  . TONSILLECTOMY AND ADENOIDECTOMY      Family History:  family history is not on file.  Social History:  Social History   Socioeconomic History  . Marital status: Single    Spouse name: Not on file  . Number of children: Not on file  . Years of education: Not on file  . Highest education level: Not on file  Occupational History  . Not on file  Social Needs  . Financial resource  strain: Not on file  . Food insecurity:    Worry: Not on file    Inability: Not on file  . Transportation needs:    Medical: Not on file    Non-medical: Not on file  Tobacco Use  . Smoking status: Current Every Day Smoker    Packs/day: 0.50    Types: Cigarettes  . Smokeless tobacco: Never Used  Substance and Sexual Activity  . Alcohol use: No    Comment: Occ  . Drug use: Yes    Types: Marijuana    Comment: 1x a day  . Sexual activity: Not on file  Lifestyle  . Physical activity:    Days per week: Not on file    Minutes per session: Not on file  . Stress: Not on file  Relationships  . Social connections:    Talks on phone: Not on file    Gets together: Not on file    Attends religious service: Not on file    Active member of club or organization: Not on file    Attends meetings of clubs or organizations: Not on file    Relationship status: Not on file  . Intimate partner violence:    Fear of current or ex partner: Not on file  Emotionally abused: Not on file    Physically abused: Not on file    Forced sexual activity: Not on file  Other Topics Concern  . Not on file  Social History Narrative  . Not on file    Home Medications:  Medications reconciled in EPIC  No current facility-administered medications on file prior to encounter.    Current Outpatient Medications on File Prior to Encounter  Medication Sig Dispense Refill  . cephALEXin (KEFLEX) 500 MG capsule Take 1 capsule (500 mg total) by mouth 4 (four) times daily. (Patient not taking: Reported on 04/29/2018) 28 capsule 0  . cetirizine (ZYRTEC) 10 MG tablet Take 1 tablet (10 mg total) by mouth daily. (Patient not taking: Reported on 04/29/2018) 30 tablet 0  . fluticasone (FLONASE) 50 MCG/ACT nasal spray Place 1 spray into both nostrils 2 (two) times daily. (Patient not taking: Reported on 04/29/2018) 16 g 0    Allergies:  Allergies  Allergen Reactions  . Loratadine Other (See Comments)    Other Reaction:  Not Assessed  . Soap Other (See Comments)    Physical Exam:  Temp:  [97.6 F (36.4 C)-97.9 F (36.6 C)] 97.6 F (36.4 C) (04/24 0408) Pulse Rate:  [77-94] 77 (04/24 0700) Resp:  [13-25] 19 (04/24 0700) BP: (126-144)/(67-87) 131/70 (04/24 0700) SpO2:  [93 %-100 %] 96 % (04/24 0700) Weight:  [95.3 kg-97.5 kg] 95.3 kg (04/24 0408)   General Appearance:  Well developed, well nourished, no acute distress, alert and oriented x3 HEENT:  Normocephalic atraumatic, extraocular movements intact, moist mucous membranes Cardiovascular:  Normal S1/S2, regular rate and rhythm, no murmurs Pulmonary:  clear to auscultation, no wheezes, rales or rhonchi, symmetric air entry, good air exchange Abdomen:  Bowel sounds present, soft, nontender, nondistended, no abnormal masses, no epigastric pain Extremities:  Full range of motion, no pedal edema, 2+ distal pulses, no tenderness Skin:  normal coloration and turgor, no rashes, no suspicious skin lesions noted  Neurologic:  Cranial nerves 2-12 grossly intact, normal muscle tone, strength 5/5 all four extremities Psychiatric:  Normal mood and affect, appropriate, no AH/VH Pelvic:  NEFG, no vulvar masses or lesions, heavy vaginal bleeding  Labs/Studies:   CBC and Coags:  Lab Results  Component Value Date   WBC 10.4 04/28/2018   NEUTOPHILPCT 63 04/28/2018   EOSPCT 5 04/28/2018   BASOPCT 1 04/28/2018   LYMPHOPCT 26 04/28/2018   HGB 13.2 04/28/2018   HCT 38.8 04/28/2018   MCV 91.1 04/28/2018   PLT 271 04/28/2018   CMP: No results found for: NA, K, CL, CO2, BUN, CREATININE, GLU, PROT, BILITOT, BILIDIR, ALT, AST, ALKPHOS Other Labs: Rh positive  Other Imaging: [place] Ob Comp Less 14 Wks  Result Date: 04/29/2018 CLINICAL DATA:  33 year old female with vaginal bleeding in the 1st trimester of pregnancy. Estimated gestational age by LMP 11 weeks and 6 days, quantitative beta HCG 5,792. EXAM: OBSTETRIC <14 WK US AND TRANSVAGINAL OB US TECHNIQUE: Both  transabdominal and transvaginal ultrasound examinations were performed for complete evaluation of the gestation as well as the maternal uterus, adnexal regions, and pelvic cul-de-sac. Transvaginal technique was performed to assess early pregnancy. COMPARISON:  Ob ultrasound yesterday. FINDINGS: Intrauterine gestational sac: Single, elongated and irregular shaped (image 32) Yolk sac:  Not visible Embryo:  Detected Cardiac Activity: Not detected MSD: 21.1 mm CRL:  6.6 mm   6 w   4 d Subchorionic hemorrhage:  None visualized. Maternal uterus/adnexae: Right ovary probably corpus luteum redemonstrated (image 65). The right ovary  today is 4.3 x 2.8 by 2.7 centimeters. Normal left ovary 4.3 x 2.4 by 2.6 centimeters. Trace free fluid in the cul-de-sac. IMPRESSION: 1. Continued small fetal pole with no cardiac activity identified. Irregular shaped gestational sac. The findings remain most compatible with fetal demise (Diagnostic Criteria for Nonviable Pregnancy Early in the First Trimester. Malva Limes Med [phone]). 2. No subchorionic hemorrhage identified today and only trace free fluid in the pelvis. The ovaries are within normal limits, corpus luteum suspected on the right. Electronically Signed   By: Odessa Fleming M.D.   On: 04/29/2018 06:38   US Ob Transvaginal  Result Date: 04/29/2018 CLINICAL DATA:  33 year old female with vaginal bleeding in the 1st trimester of pregnancy. Estimated gestational age by LMP 11 weeks and 6 days, quantitative beta HCG 5,792. EXAM: OBSTETRIC <14 WK [place] AND TRANSVAGINAL OB US TECHNIQUE: Both transabdominal and transvaginal ultrasound examinations were performed for complete evaluation of the gestation as well as the maternal uterus, adnexal regions, and pelvic cul-de-sac. Transvaginal technique was performed to assess early pregnancy. COMPARISON:  Ob ultrasound yesterday. FINDINGS: Intrauterine gestational sac: Single, elongated and irregular shaped (image 32) Yolk sac:  Not visible  Embryo:  Detected Cardiac Activity: Not detected MSD: 21.1 mm CRL:  6.6 mm   6 w   4 d Subchorionic hemorrhage:  None visualized. Maternal uterus/adnexae: Right ovary probably corpus luteum redemonstrated (image 65). The right ovary today is 4.3 x 2.8 by 2.7 centimeters. Normal left ovary 4.3 x 2.4 by 2.6 centimeters. Trace free fluid in the cul-de-sac. IMPRESSION: 1. Continued small fetal pole with no cardiac activity identified. Irregular shaped gestational sac. The findings remain most compatible with fetal demise (Diagnostic Criteria for Nonviable Pregnancy Early in the First Trimester. Malva Limes Med [phone]). 2. No subchorionic hemorrhage identified today and only trace free fluid in the pelvis. The ovaries are within normal limits, corpus luteum suspected on the right. Electronically Signed   By: Odessa Fleming M.D.   On: 04/29/2018 06:38   US Ob Less Than 14 Weeks With Ob Transvaginal  Result Date: 04/28/2018 CLINICAL DATA:  Vaginal bleeding for 1 day EXAM: OBSTETRIC <14 WK [place] AND TRANSVAGINAL OB US TECHNIQUE: Both transabdominal and transvaginal ultrasound examinations were performed for complete evaluation of the gestation as well as the maternal uterus, adnexal regions, and pelvic cul-de-sac. Transvaginal technique was performed to assess early pregnancy. COMPARISON:  None. FINDINGS: Intrauterine gestational sac: Single Yolk sac:  Not Visualized. Embryo:  Visualized. Cardiac Activity: Not Visualized. CRL: 7 mm   6 w   4 d                  [place] EDC: 12/18/2018 Subchorionic hemorrhage:  Small subchorionic hemorrhage Maternal uterus/adnexae: Ovaries are within normal limits. Right ovary measures 4.5 x 3.1 by 2.7 cm and contains a corpus luteum. The left ovary measures 4 x 3.1 x 3.3 cm. No significant free fluid IMPRESSION: 1. Single intrauterine pregnancy with visible embryo. Average crown-rump length of 7 mm but no fetal cardiac activity. Findings meet definitive criteria for failed pregnancy. This  follows SRU consensus guidelines: Diagnostic Criteria for Nonviable Pregnancy Early in the First Trimester. Macy Mis J Med [phone]. 2. Small subchorionic hemorrhage Electronically Signed   By: Jasmine Pang M.D.   On: 04/28/2018 21:48     Assessment / Plan:   Carmon Vannest is a 33 y.o. G2P1001 who presents with incomplete abortion with severe pain and heavy bleeding  Spontaneous miscarriage: Causes of spontaneous  miscarriage discussed with patient, including prevalence, common causes, and the expectation that this event does not increase the chance that she will miscarry again in the future. Emotional support given.  Management options discussed, including: Expectant, medical and surgical.  Pt has opted for: suction D&C  Consents signed today. Risks of surgery were discussed with the patient including but not limited to: bleeding which may require transfusion; infection which may require antibiotics; injury to uterus or surrounding organs; intrauterine scarring which may impair future fertility; need for additional procedures including laparotomy or laparoscopy; and other postoperative/anesthesia complications. Written informed consent was obtained.  This is a scheduled same-day surgery. She will have a postop visit in 2 weeks to review operative findings and pathology.

## 2018-04-29 NOTE — ED Notes (Signed)
Pt asking for something to drink, does she need iv fluids, lights cut out. Pt given call bell, pillow, told edp said no po until seen by OB, no IV fluid needed at this time. Lights dimmed.

## 2018-04-29 NOTE — ED Notes (Signed)
Pt co increased abd cramping, med given per md order.

## 2018-04-29 NOTE — ED Notes (Signed)
Pt to US.

## 2018-04-29 NOTE — ED Notes (Signed)
Pt is [redacted] weeks pregnant states P2G1, was here yesterday for vaginal bleeding. Had abnormal [place] and was dx with probable miscarriage. Pt states went home and started having severe abd cramping and increased vaginal bleeding passing large clots.

## 2018-04-29 NOTE — Op Note (Signed)
Operative Report Suction Dilation and Curettage   Indications: Incomplete AB with heavy bleeding and severe pain   Pre-operative Diagnosis: Missed abortion at 7 weeks  Post-operative Diagnosis: same.  Procedure: 1. Suction D&C  Surgeon: Christeen Douglas  Assistant(s):  Heloise Ochoa, CNM  Anesthesia: Local anesthesia 0.5% bupivacaine, with epinephrine-cervical block and IV regional  Estimated Blood Loss:  200 mL         Intraoperative medications: none         Total IV Fluids:  Urine Output:         Specimens: Products of conception; endometrial curretings         Complications:  None; patient tolerated the procedure well.         Disposition: PACU - hemodynamically stable.         Condition: stable  Findings: Uterus measuring 8 weeks; normal cervix, vagina, perineum.   Indication for procedure/Consents: 33 y.o. G2P1001 here for scheduled surgery for the aforementioned diagnoses.   Risks of surgery were discussed with the patient including but not limited to: bleeding which may require transfusion; infection which may require antibiotics; injury to uterus or surrounding organs; intrauterine scarring which may impair future fertility; need for additional procedures including laparotomy or laparoscopy; and other postoperative/anesthesia complications. Written informed consent was obtained.    Procedure Details:   The patient received oral antibiotics while in the preoperative area.  She was then taken to the operating room where general anesthesia was administered and was found to be adequate.  After a formal and adequate timeout was performed, she was placed in the dorsal lithotomy position and examined with the above findings. She was then prepped and draped in the sterile manner.   Her bladder was catheterized for an estimated amount of clear, yellow urine. A speculum was then placed in the patient's vagina and a single tooth tenaculum was applied to the anterior  lip of the cervix.    No uterine sounding was performed on this pregnant uterus. Her cervix was serially dilated to accommodate a 8 sized flexible suction curette.  A sharp curettage was then performed until there was a gritty texture in all four quadrants.  The tenaculum was removed from the anterior lip of the cervix and the vaginal speculum was removed after noting good hemostasis. The patient tolerated the procedure well and was taken to the recovery area awake, extubated and in stable condition.  The patient will be discharged to home as per PACU criteria.  She will receive another dose of oral antibiotics prior to discharge. Routine postoperative instructions given.  She was prescribed Percocet, Ibuprofen and Colace.  She will follow up in the clinic in two weeks for postoperative evaluation.

## 2018-04-29 NOTE — ED Notes (Signed)
Pt up to restroom at this time with steady gait noted.

## 2018-04-29 NOTE — Discharge Instructions (Signed)
Discharge instructions after a dilation and curettage  Signs and Symptoms to Report  Call our office at [phone] if you have any of the following:    Fever over 100.4 degrees or higher  Severe stomach pain not relieved with pain medications  Bright red bleeding thats heavier than a period that does not slow with rest after the first 24 hours  To go the bathroom a lot (frequency), you cant hold your urine (urgency), or it hurts when you empty your bladder (urinate)  Chest pain  Shortness of breath  Pain in the calves of your legs  Severe nausea and vomiting not relieved with anti-nausea medications  Any concerns  What You Can Expect after Surgery  You may see some pink tinged, bloody fluid. This is normal. You may also have cramping for several days.   Activities after Your Discharge Follow these guidelines to help speed your recovery at home:  Dont drive if you are in pain or taking narcotic pain medicine. You may drive when you can safely slam on the brakes, turn the wheel forcefully, and rotate your torso comfortably. This is typically 4-7 days. Practice in a parking lot or side street prior to attempting to drive regularly.   Ask others to help with household chores for 4 weeks.  Dont do strenuous activities, exercises, or sports like vacuuming, tennis, squash, etc. until your doctor says it is safe to do so.  Walk as you feel able. Rest often since it may take a week or two for your energy level to return to normal.   You may climb stairs  Avoid constipation:   -Eat fruits, vegetables, and whole grains. Eat small meals as your appetite will take time to return to normal.   -Drink 6 to 8 glasses of water each day unless your doctor has told you to limit your fluids.   -Use a laxative or stool softener as needed if constipation becomes a problem. You may take Miralax, metamucil, Citrucil, Colace, Senekot, FiberCon, etc. If this does not relieve the  constipation, try two tablespoons of Milk Of Magnesia every 8 hours until your bowels move.   You may shower.   Do not get in a hot tub, swimming pool, etc. until your doctor agrees.  Do not douche, use tampons, or have sex until your doctor says it is okay, usually about 2 weeks.  Take your pain medicine when you need it. The medicine may not work as well if the pain is bad.  Take the medicines you were taking before surgery. Other medications you might need are pain medications (ibuprofen), medications for constipation (Colace) and nausea medications (Zofran).     Dilation and Curettage or Vacuum Curettage, Care After This sheet gives you information about how to care for yourself after your procedure. Your health care provider may also give you more specific instructions. If you have problems or questions, contact your health care provider. What can I expect after the procedure? After your procedure, it is common to have:  Mild pain or cramping.  Some vaginal bleeding or spotting. These may last for up to 2 weeks after your procedure. Follow these instructions at home: Activity   Do not drive or use heavy machinery while taking prescription pain medicine.  Avoid driving for the first 24 hours after your procedure.  Take frequent, short walks, followed by rest periods, throughout the day. Ask your health care provider what activities are safe for you. After 1-2 days, you may  be able to return to your normal activities.  Do not lift anything heavier than 10 lb (4.5 kg) until your health care provider approves.  For at least 2 weeks, or as long as told by your health care provider, do not: ? Douche. ? Use tampons. ? Have sexual intercourse. General instructions   Take over-the-counter and prescription medicines only as told by your health care provider. This is especially important if you take blood thinning medicine.  Do not take baths, swim, or use a hot tub until your  health care provider approves. Take showers instead of baths.  Wear compression stockings as told by your health care provider. These stockings help to prevent blood clots and reduce swelling in your legs.  It is your responsibility to get the results of your procedure. Ask your health care provider, or the department performing the procedure, when your results will be ready.  Keep all follow-up visits as told by your health care provider. This is important. Contact a health care provider if:  You have severe cramps that get worse or that do not get better with medicine.  You have severe abdominal pain.  You cannot drink fluids without vomiting.  You develop pain in a different area of your pelvis.  You have bad-smelling vaginal discharge.  You have a rash. Get help right away if:  You have vaginal bleeding that soaks more than one sanitary pad in 1 hour, for 2 hours in a row.  You pass large blood clots from your vagina.  You have a fever that is above 100.10F (38.0C).  Your abdomen feels very tender or hard.  You have chest pain.  You have shortness of breath.  You cough up blood.  You feel dizzy or light-headed.  You faint.  You have pain in your neck or shoulder area. This information is not intended to replace advice given to you by your health care provider. Make sure you discuss any questions you have with your health care provider. Document Released: 12/20/1999 Document Revised: 08/21/2015 Document Reviewed: 07/25/2015 Elsevier Interactive Patient Education  2019 [employer]   AMBULATORY SURGERY  DISCHARGE INSTRUCTIONS   1) The drugs that you were given will stay in your system until tomorrow so for the next 24 hours you should not:  A) Drive an automobile B) Make any legal decisions C) Drink any alcoholic beverage   2) You may resume regular meals tomorrow.  Today it is better to start with liquids and gradually work up to solid foods.  You may  eat anything you prefer, but it is better to start with liquids, then soup and crackers, and gradually work up to solid foods.   3) Please notify your doctor immediately if you have any unusual bleeding, trouble breathing, redness and pain at the surgery site, drainage, fever, or pain not relieved by medication.    4) Additional Instructions:        Please contact your physician with any problems or Same Day Surgery at [phone], Monday through Friday 6 am to 4 pm, or [place] [facility] number at [phone].# 5

## 2018-04-29 NOTE — Anesthesia Preprocedure Evaluation (Addendum)
Anesthesia Evaluation  Patient identified by MRN, date of birth, ID band Patient awake    Reviewed: Allergy & Precautions, H&P , NPO status , Patient's Chart, lab work & pertinent test results, reviewed documented beta blocker date and time   History of Anesthesia Complications Negative for: history of anesthetic complications  Airway Mallampati: II  TM Distance: >3 FB Neck ROM: full    Dental  (+) Dental Advidsory Given, Chipped, Poor Dentition   Pulmonary Current Smoker,           Cardiovascular Exercise Tolerance: Good negative cardio ROS       Neuro/Psych negative neurological ROS  negative psych ROS   GI/Hepatic Neg liver ROS, GERD  ,  Endo/Other  negative endocrine ROS  Renal/GU negative Renal ROS  negative genitourinary   Musculoskeletal   Abdominal   Peds  Hematology negative hematology ROS (+)   Anesthesia Other Findings Past Medical History: No date: Seasonal allergies   Reproductive/Obstetrics negative OB ROS                            Anesthesia Physical Anesthesia Plan  ASA: II  Anesthesia Plan: General   Post-op Pain Management:    Induction: Intravenous  PONV Risk Score and Plan: 2 and Propofol infusion and TIVA  Airway Management Planned: Natural Airway and Simple Face Mask  Additional Equipment:   Intra-op Plan:   Post-operative Plan:   Informed Consent: I have reviewed the patients History and Physical, chart, labs and discussed the procedure including the risks, benefits and alternatives for the proposed anesthesia with the patient or authorized representative who has indicated his/her understanding and acceptance.     Dental Advisory Given  Plan Discussed with: Anesthesiologist, CRNA and Surgeon  Anesthesia Plan Comments:        Anesthesia Quick Evaluation

## 2018-04-29 NOTE — Anesthesia Postprocedure Evaluation (Signed)
Anesthesia Post Note  Patient: Rachel Parker  Procedure(s) Performed: DILATATION AND EVACUATION (N/A )  Patient location during evaluation: PACU Anesthesia Type: General Level of consciousness: awake and alert Pain management: pain level controlled Vital Signs Assessment: post-procedure vital signs reviewed and stable Respiratory status: spontaneous breathing, nonlabored ventilation, respiratory function stable and patient connected to nasal cannula oxygen Cardiovascular status: blood pressure returned to baseline and stable Postop Assessment: no apparent nausea or vomiting Anesthetic complications: no     Last Vitals:  Vitals:   04/29/18 1155 04/29/18 1212  BP: 115/65 123/74  Pulse: 69 77  Resp: 18 16  Temp: (!) 36.2 C 36.7 C  SpO2: 92% 99%    Last Pain:  Vitals:   04/29/18 1212  TempSrc: Oral  PainSc: 3                  Lenard Simmer

## 2018-05-01 ENCOUNTER — Encounter: Payer: Self-pay | Admitting: Obstetrics and Gynecology

## 2018-05-02 ENCOUNTER — Encounter: Payer: Self-pay | Admitting: Obstetrics and Gynecology

## 2018-05-02 LAB — SURGICAL PATHOLOGY

## 2018-11-08 ENCOUNTER — Other Ambulatory Visit: Payer: Self-pay | Admitting: *Deleted

## 2018-11-08 DIAGNOSIS — K624 Stenosis of anus and rectum: Secondary | ICD-10-CM

## 2018-11-10 LAB — NOVEL CORONAVIRUS, NAA: SARS-CoV-2, NAA: NOT DETECTED

## 2019-01-29 ENCOUNTER — Other Ambulatory Visit: Payer: Self-pay

## 2019-01-29 ENCOUNTER — Encounter: Payer: Self-pay | Admitting: Emergency Medicine

## 2019-01-29 ENCOUNTER — Emergency Department
Admission: EM | Admit: 2019-01-29 | Discharge: 2019-01-30 | Disposition: A | Discharge status: Home / Self Care | Payer: 59 | Attending: Emergency Medicine | Admitting: Emergency Medicine

## 2019-01-29 DIAGNOSIS — R110 Nausea: Secondary | ICD-10-CM | POA: Diagnosis not present

## 2019-01-29 DIAGNOSIS — L03311 Cellulitis of abdominal wall: Secondary | ICD-10-CM | POA: Diagnosis not present

## 2019-01-29 DIAGNOSIS — F17210 Nicotine dependence, cigarettes, uncomplicated: Secondary | ICD-10-CM | POA: Diagnosis not present

## 2019-01-29 DIAGNOSIS — L089 Local infection of the skin and subcutaneous tissue, unspecified: Secondary | ICD-10-CM | POA: Diagnosis present

## 2019-01-29 MED ORDER — SODIUM CHLORIDE 0.9 % IV BOLUS
1000.0000 mL | Freq: Once | INTRAVENOUS | Status: AC
  Administered 2019-01-30: 1000 mL via INTRAVENOUS

## 2019-01-29 MED ORDER — CLINDAMYCIN PHOSPHATE 600 MG/50ML IV SOLN
600.0000 mg | Freq: Once | INTRAVENOUS | Status: AC
  Administered 2019-01-30: 600 mg via INTRAVENOUS
  Filled 2019-01-29: qty 50

## 2019-01-29 MED ORDER — MORPHINE SULFATE (PF) 4 MG/ML IV SOLN
4.0000 mg | Freq: Once | INTRAVENOUS | Status: AC
  Administered 2019-01-30: 4 mg via INTRAVENOUS
  Filled 2019-01-29: qty 1

## 2019-01-29 MED ORDER — ONDANSETRON HCL 4 MG/2ML IJ SOLN
4.0000 mg | Freq: Once | INTRAMUSCULAR | Status: AC
  Administered 2019-01-30: 4 mg via INTRAVENOUS
  Filled 2019-01-29: qty 2

## 2019-01-29 NOTE — ED Provider Notes (Signed)
[facility] Emergency Department Provider Note   ____________________________________________   First MD Initiated Contact with Patient 01/29/19 2340     (approximate)  I have reviewed the triage vital signs and the nursing notes.   HISTORY  Chief Complaint Abscess   HPI Rachel Parker is a 34 y.o. female who presents to the ED from home with a chief complaint of infection to her suprapubic area for the past 5 days.  Endorses nausea but denies fever, chills, cough, chest pain, shortness of breath, abdominal pain, vomiting.       Past Medical History:  Diagnosis Date  . Seasonal allergies     There are no problems to display for this patient.   Past Surgical History:  Procedure Laterality Date  . DILATION AND EVACUATION N/A 04/29/2018   Procedure: DILATATION AND EVACUATION;  Surgeon: Christeen Douglas, MD;  Location: [facility] ORS;  Service: Gynecology;  Laterality: N/A;  . TONSILLECTOMY AND ADENOIDECTOMY      Prior to Admission medications   Medication Sig Start Date End Date Taking? Authorizing Provider  cetirizine (ZYRTEC) 10 MG tablet Take 1 tablet (10 mg total) by mouth daily. Patient not taking: Reported on 04/29/2018 10/13/15   Cuthriell, Delorise Royals, PA-C  clindamycin (CLEOCIN) 300 MG capsule Take 1 capsule (300 mg total) by mouth 3 (three) times daily. 01/30/19   Irean Hong, MD  fluticasone (FLONASE) 50 MCG/ACT nasal spray Place 1 spray into both nostrils 2 (two) times daily. Patient not taking: Reported on 04/29/2018 10/13/15   Cuthriell, Delorise Royals, PA-C  oxyCODONE-acetaminophen (PERCOCET/ROXICET) 5-325 MG tablet Take 1 tablet by mouth every 4 (four) hours as needed for severe pain. 01/30/19   Irean Hong, MD    Allergies Patient has no known allergies.  No family history on file.  Social History Social History   Tobacco Use  . Smoking status: Current Every Day Smoker    Packs/day: 0.50    Types: Cigarettes  . Smokeless tobacco:  Never Used  Substance Use Topics  . Alcohol use: No    Comment: Occ  . Drug use: Yes    Types: Marijuana    Comment: 1x a day    Review of Systems  Constitutional: No fever/chills Eyes: No visual changes. ENT: No sore throat. Cardiovascular: Denies chest pain. Respiratory: Denies shortness of breath. Gastrointestinal: No abdominal pain.  No nausea, no vomiting.  No diarrhea.  No constipation. Genitourinary: Negative for dysuria. Musculoskeletal: Negative for back pain. Skin: Positive for infection.  Negative for rash. Neurological: Negative for headaches, focal weakness or numbness.   ____________________________________________   PHYSICAL EXAM:  VITAL SIGNS: ED Triage Vitals  Enc Vitals Group     BP 01/29/19 2301 (!) 134/97     Pulse Rate 01/29/19 2301 90     Resp 01/29/19 2301 18     Temp 01/29/19 2301 98.6 F (37 C)     Temp Source 01/29/19 2301 Oral     SpO2 01/29/19 2301 96 %     Weight 01/29/19 2302 200 lb (90.7 kg)     Height 01/29/19 2302 5\' 5"  (1.651 m)     Head Circumference --      Peak Flow --      Pain Score 01/29/19 2301 10     Pain Loc --      Pain Edu? --      Excl. in GC? --     Constitutional: Alert and oriented. Well appearing and in mild acute distress.  Eyes: Conjunctivae are normal. PERRL. EOMI. Head: Atraumatic. Nose: No congestion/rhinnorhea. Mouth/Throat: Mucous membranes are moist.  Oropharynx non-erythematous. Neck: No stridor.   Cardiovascular: Normal rate, regular rhythm. Grossly normal heart sounds.  Good peripheral circulation. Respiratory: Normal respiratory effort.  No retractions. Lungs CTAB. Gastrointestinal: Palm-sized area of cellulitis to lower pannus.  No fluctuance but small pockets of induration tenderness.  No distention. No abdominal bruits. No CVA tenderness. Musculoskeletal: No lower extremity tenderness nor edema.  No joint effusions. Neurologic:  Normal speech and language. No gross focal neurologic deficits are  appreciated. No gait instability. Skin:  Skin is warm, dry and intact. No rash noted. Psychiatric: Mood and affect are normal. Speech and behavior are normal.  ____________________________________________   LABS (all labs ordered are listed, but only abnormal results are displayed)  Labs Reviewed  CBC WITH DIFFERENTIAL/PLATELET - Abnormal; Notable for the following components:      Result Value   WBC 13.8 (*)    Neutro Abs 8.8 (*)    Eosinophils Absolute 0.6 (*)    All other components within normal limits  BASIC METABOLIC PANEL - Abnormal; Notable for the following components:   Sodium 133 (*)    CO2 21 (*)    Glucose, Bld 225 (*)    Calcium 8.8 (*)    All other components within normal limits  CULTURE, BLOOD (ROUTINE X 2)  CULTURE, BLOOD (ROUTINE X 2)  LACTIC ACID, PLASMA   ____________________________________________  EKG  None ____________________________________________  RADIOLOGY  ED MD interpretation: None  Official radiology report(s): No results found.  ____________________________________________   PROCEDURES  Procedure(s) performed (including Critical Care):  Procedures   ____________________________________________   INITIAL IMPRESSION / ASSESSMENT AND PLAN / ED COURSE  As part of my medical decision making, I reviewed the following data within the electronic MEDICAL RECORD NUMBER Nursing notes reviewed and incorporated, Labs reviewed, Old chart reviewed, Notes from prior ED visits and [place] Controlled Substance Database     Rachel Parker was evaluated in Emergency Department on 01/30/2019 for the symptoms described in the history of present illness. She was evaluated in the context of the global COVID-19 pandemic, which necessitated consideration that the patient might be at risk for infection with the SARS-CoV-2 virus that causes COVID-19. Institutional protocols and algorithms that pertain to the evaluation of patients at risk for COVID-19 are in a  state of rapid change based on information released by regulatory bodies including the CDC and federal and state organizations. These policies and algorithms were followed during the patient's care in the ED.    34 year old female who presents with redness and swelling to her lower abdomen.  Differential diagnosis includes but is not limited to cellulitis, abscess, panniculitis, etc.  Will obtain basic lab work, lactic acid.  Initiate IV clindamycin.  Administer IV analgesia and reassess.   Clinical Course as of Jan 29 609  Mon Jan 30, 2019  0129 Updated patient on laboratory results which demonstrate mild leukocytosis, normal lactic acid.  Will discharge home after completion of IV antibiotic.  Strict return precautions given.  Patient verbalizes understanding agrees with plan of care.   [JS]    Clinical Course User Index [JS] Irean Hong, MD     ____________________________________________   FINAL CLINICAL IMPRESSION(S) / ED DIAGNOSES  Final diagnoses:  Cellulitis of abdominal wall     ED Discharge Orders         Ordered    clindamycin (CLEOCIN) 300 MG capsule  3 times daily  01/30/19 0137    oxyCODONE-acetaminophen (PERCOCET/ROXICET) 5-325 MG tablet  Every 4 hours PRN     01/30/19 5638           Note:  This document was prepared using Dragon voice recognition software and may include unintentional dictation errors.   Paulette Blanch, MD 01/30/19 [phone]

## 2019-01-29 NOTE — ED Triage Notes (Signed)
Patient to abscess supra pubic area for 5 days.

## 2019-01-30 DIAGNOSIS — L03311 Cellulitis of abdominal wall: Secondary | ICD-10-CM | POA: Diagnosis not present

## 2019-01-30 LAB — LACTIC ACID, PLASMA: Lactic Acid, Venous: 1.3 mmol/L (ref 0.5–1.9)

## 2019-01-30 LAB — BASIC METABOLIC PANEL
Anion gap: 9 (ref 5–15)
BUN: 11 mg/dL (ref 6–20)
CO2: 21 mmol/L — ABNORMAL LOW (ref 22–32)
Calcium: 8.8 mg/dL — ABNORMAL LOW (ref 8.9–10.3)
Chloride: 103 mmol/L (ref 98–111)
Creatinine, Ser: 0.46 mg/dL (ref 0.44–1.00)
GFR calc Af Amer: >60 mL/min (ref >60)
GFR calc non Af Amer: >60 mL/min (ref >60)
Glucose, Bld: 225 mg/dL — ABNORMAL HIGH (ref 70–99)
Potassium: 3.6 mmol/L (ref 3.5–5.1)
Sodium: 133 mmol/L — ABNORMAL LOW (ref 135–145)

## 2019-01-30 LAB — CBC WITH DIFFERENTIAL/PLATELET
Abs Immature Granulocytes: 0.06 10*3/uL (ref 0.00–0.07)
Basophils Absolute: 0.1 10*3/uL (ref 0.0–0.1)
Basophils Relative: 0 %
Eosinophils Absolute: 0.6 10*3/uL — ABNORMAL HIGH (ref 0.0–0.5)
Eosinophils Relative: 4 %
HCT: 41.1 % (ref 36.0–46.0)
Hemoglobin: 14.2 g/dL (ref 12.0–15.0)
Immature Granulocytes: 0 %
Lymphocytes Relative: 26 %
Lymphs Abs: 3.5 10*3/uL (ref 0.7–4.0)
MCH: 30.7 pg (ref 26.0–34.0)
MCHC: 34.5 g/dL (ref 30.0–36.0)
MCV: 89 fL (ref 80.0–100.0)
Monocytes Absolute: 0.8 10*3/uL (ref 0.1–1.0)
Monocytes Relative: 6 %
Neutro Abs: 8.8 10*3/uL — ABNORMAL HIGH (ref 1.7–7.7)
Neutrophils Relative %: 64 %
Platelets: 276 10*3/uL (ref 150–400)
RBC: 4.62 MIL/uL (ref 3.87–5.11)
RDW: 12.6 % (ref 11.5–15.5)
WBC: 13.8 10*3/uL — ABNORMAL HIGH (ref 4.0–10.5)
nRBC: 0 % (ref 0.0–0.2)

## 2019-01-30 MED ORDER — OXYCODONE-ACETAMINOPHEN 5-325 MG PO TABS
1.0000 | ORAL_TABLET | Freq: Once | ORAL | Status: AC
  Administered 2019-01-30: 1 via ORAL
  Filled 2019-01-30: qty 1

## 2019-01-30 MED ORDER — CLINDAMYCIN HCL 300 MG PO CAPS: 300.0000 mg | ORAL_CAPSULE | Freq: Three times a day (TID) | ORAL | 0 refills | Status: DC

## 2019-01-30 MED ORDER — OXYCODONE-ACETAMINOPHEN 5-325 MG PO TABS: 1.0000 | ORAL_TABLET | ORAL | 0 refills | Status: DC | PRN

## 2019-01-30 NOTE — Discharge Instructions (Signed)
1.  Take antibiotic as prescribed (clindamycin 300 mg 3 times daily x7 days). 2.  Soak affected area several times daily. 3.  You may take Ibuprofen as needed for pain; Percocet as needed for more severe pain. 4.  Return to the ER for worsening symptoms, persistent vomiting, difficulty breathing or other concerns.

## 2019-02-04 LAB — CULTURE, BLOOD (ROUTINE X 2)
Culture: NO GROWTH
Culture: NO GROWTH
Special Requests: ADEQUATE

## 2019-09-26 IMAGING — US OBSTETRIC <14 WK US AND TRANSVAGINAL OB US
1 series · 13 of 28 positions shown · non-contrast
Comparison: None.

CLINICAL DATA: Vaginal bleeding for 1 day

EXAM:
OBSTETRIC <14 WK US AND TRANSVAGINAL OB US
TECHNIQUE: Both transabdominal and transvaginal ultrasound examinations were
performed for complete evaluation of the gestation as well as the
maternal uterus, adnexal regions, and pelvic cul-de-sac.
Transvaginal technique was performed to assess early pregnancy.

[Series 1: obstetric <14 wk us and transvaginal ob us · 13 of 104 slices shown]
[im 4/104]
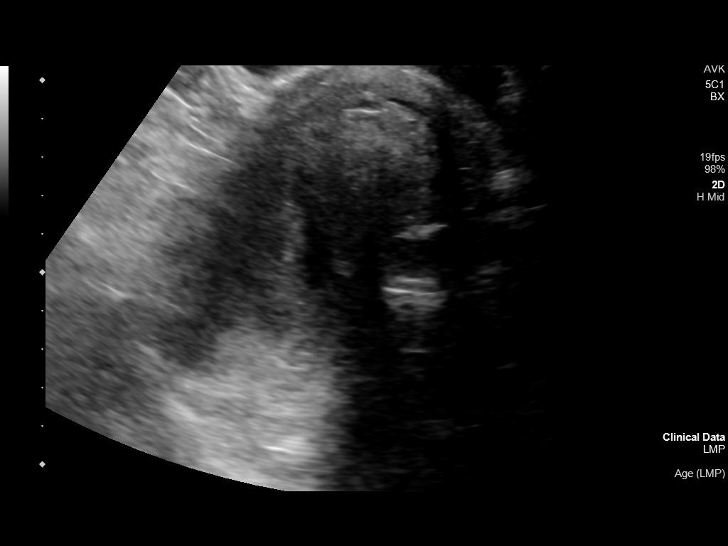
[im 12/104]
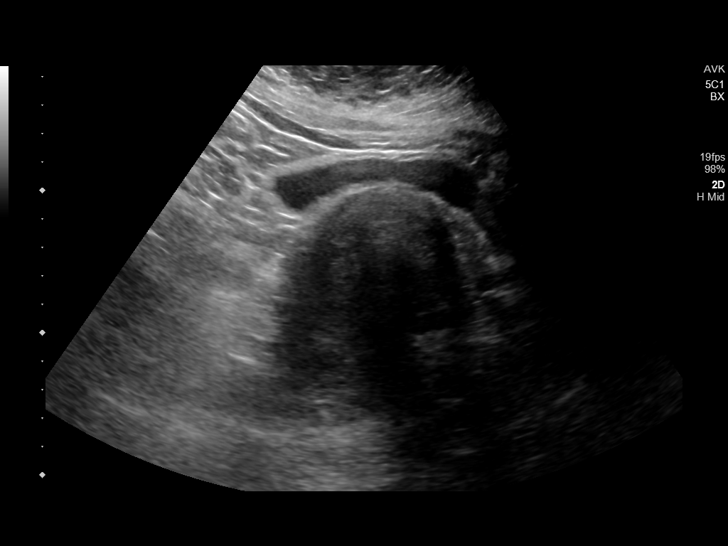
[im 20/104]
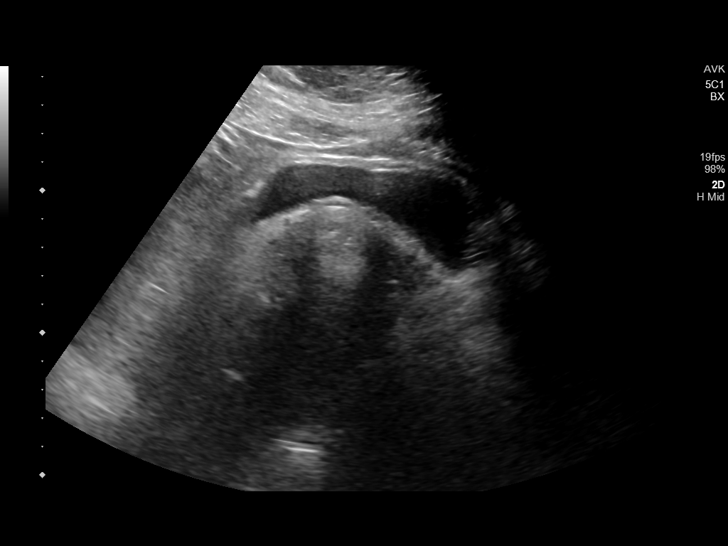
[im 27/104]
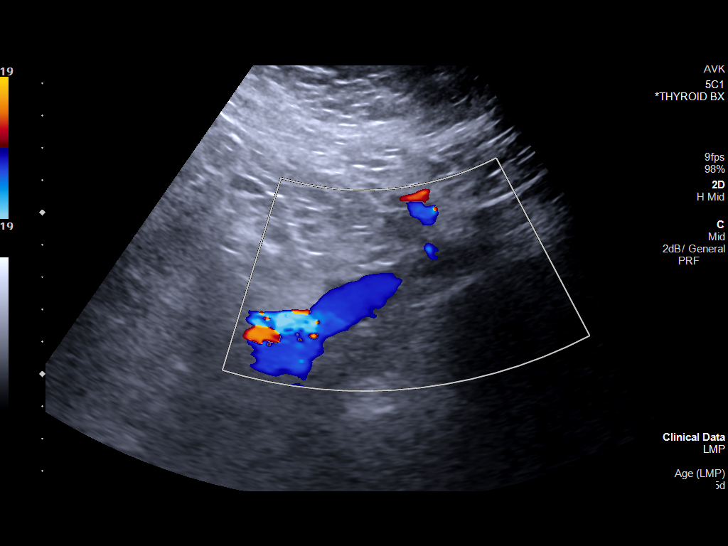
[im 35/104]
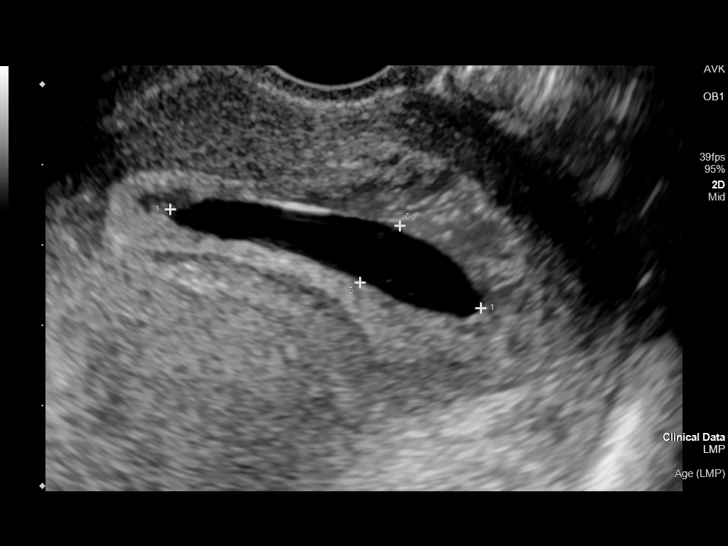
[im 42/104]
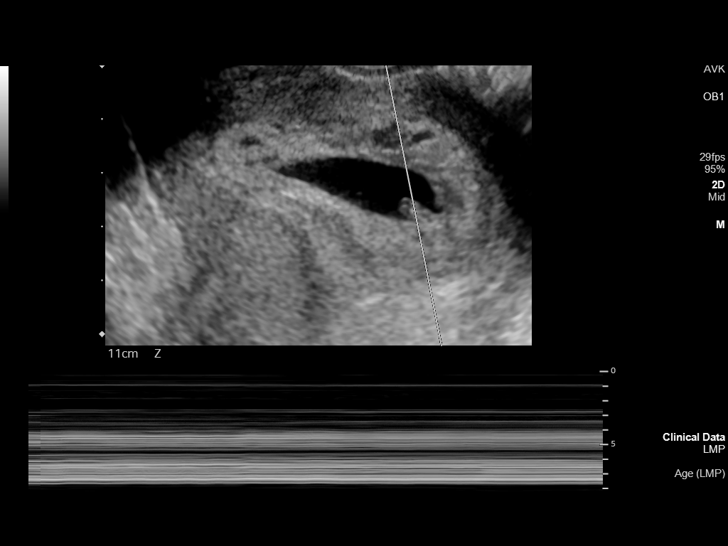
[im 54/104]
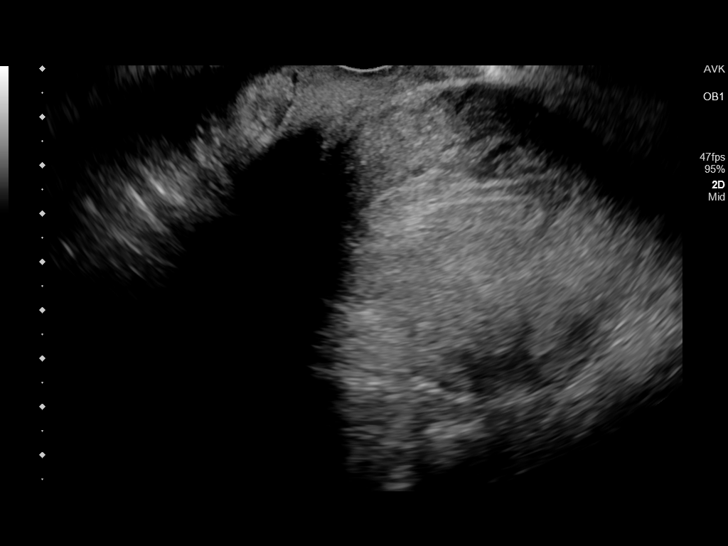
[im 62/104]
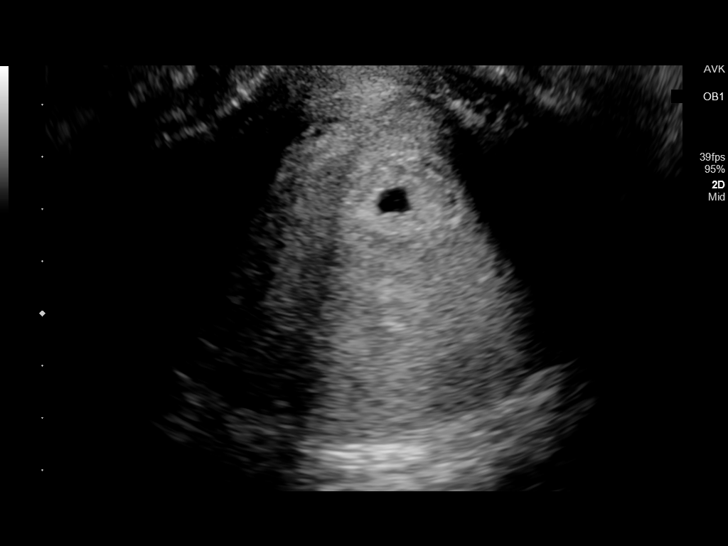
[im 69/104]
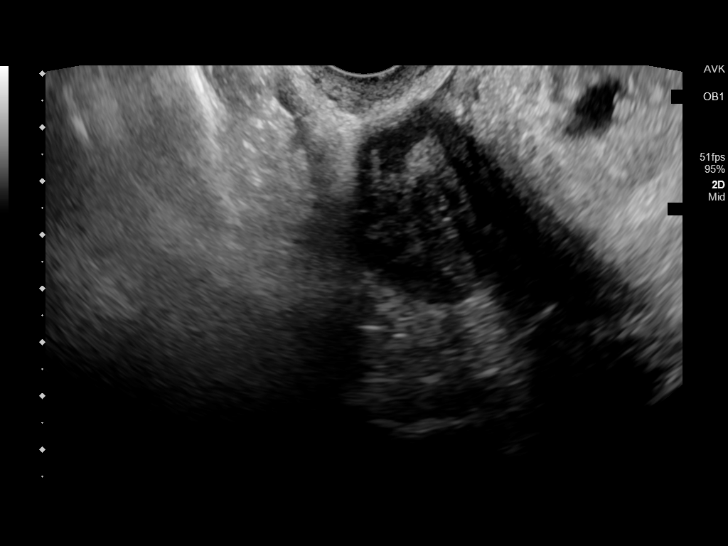
[im 77/104]
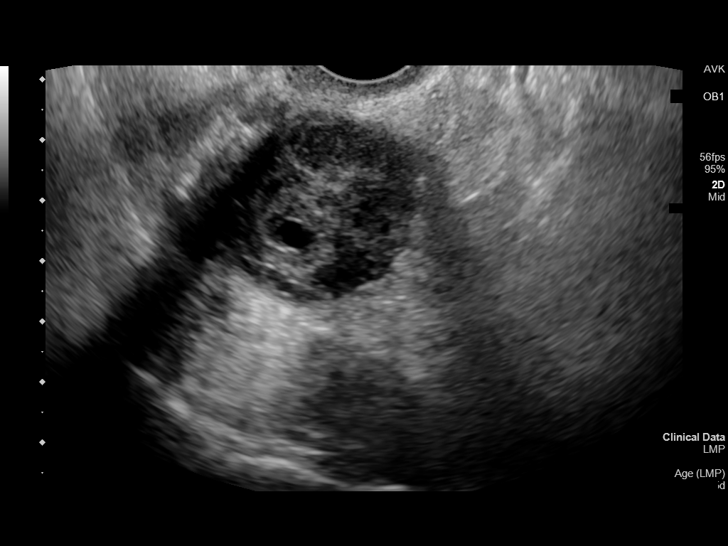
[im 84/104]
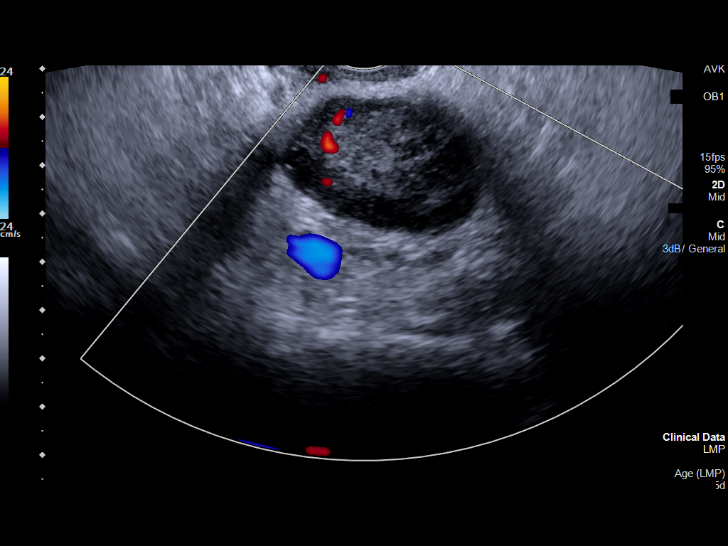
[im 92/104]
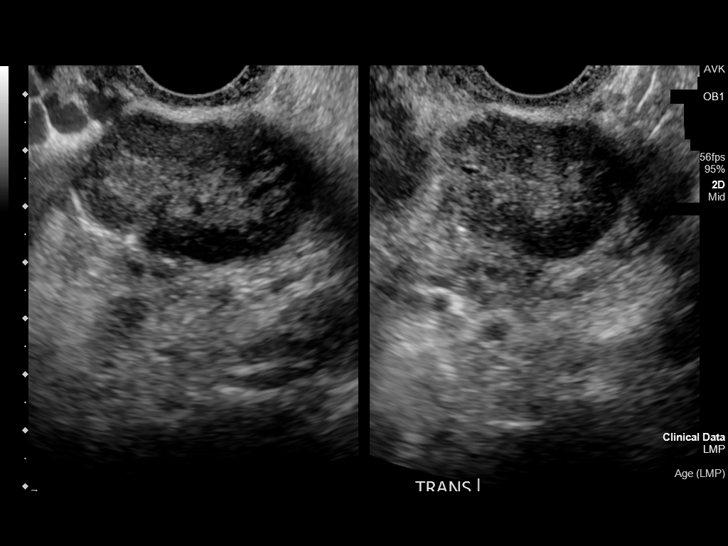
[im 100/104]
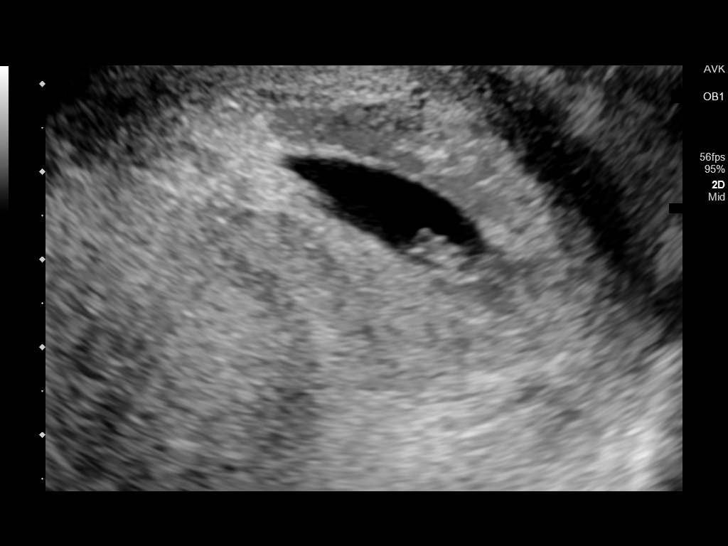

[13 of 28 positions shown; findings below may reference images not displayed]

FINDINGS: Intrauterine gestational sac: Single

Yolk sac:  Not Visualized.

Embryo:  Visualized.

Cardiac Activity: Not Visualized.

CRL: 7 mm   6 w   4 d                  US EDC: 12/18/2018

Subchorionic hemorrhage:  Small subchorionic hemorrhage

Maternal uterus/adnexae: Ovaries are within normal limits. Right
ovary measures 4.5 x 3.1 by 2.7 cm and contains a corpus luteum. The
left ovary measures 4 x 3.1 x 3.3 cm. No significant free fluid
IMPRESSION: 1. Single intrauterine pregnancy with visible embryo. Average
crown-rump length of 7 mm but no fetal cardiac activity. Findings
meet definitive criteria for failed pregnancy. This follows SRU
consensus guidelines: Diagnostic Criteria for Nonviable Pregnancy
Early in the First Trimester. N Engl J Med 5470;[DATE].
2. Small subchorionic hemorrhage

## 2020-02-09 ENCOUNTER — Encounter: Payer: Self-pay | Admitting: Family Medicine

## 2020-02-09 ENCOUNTER — Ambulatory Visit: Payer: Self-pay | Admitting: Family Medicine

## 2020-02-09 ENCOUNTER — Other Ambulatory Visit: Payer: Self-pay

## 2020-02-09 DIAGNOSIS — B9689 Other specified bacterial agents as the cause of diseases classified elsewhere: Secondary | ICD-10-CM

## 2020-02-09 DIAGNOSIS — B3731 Acute candidiasis of vulva and vagina: Secondary | ICD-10-CM

## 2020-02-09 DIAGNOSIS — Z113 Encounter for screening for infections with a predominantly sexual mode of transmission: Secondary | ICD-10-CM

## 2020-02-09 DIAGNOSIS — N760 Acute vaginitis: Secondary | ICD-10-CM

## 2020-02-09 DIAGNOSIS — B373 Candidiasis of vulva and vagina: Secondary | ICD-10-CM

## 2020-02-09 LAB — WET PREP FOR TRICH, YEAST, CLUE
Trichomonas Exam: NEGATIVE
Yeast Exam: NEGATIVE

## 2020-02-09 MED ORDER — METRONIDAZOLE 500 MG PO TABS: 500.0000 mg | ORAL_TABLET | Freq: Two times a day (BID) | ORAL | 0 refills | Status: AC

## 2020-02-09 MED ORDER — CLOTRIMAZOLE 1 % VA CREA: 1.0000 | TOPICAL_CREAM | Freq: Every day | VAGINAL | 0 refills | Status: AC

## 2020-02-09 NOTE — Progress Notes (Signed)
Wet mount reviewed, patient treated for BV per SO. Patient also treated for yeast and counseled to begin clotrimazole about 3-4 days after beginning metronidazole, per provider orders. Patient states understanding.Burt Knack, RN

## 2020-02-09 NOTE — Progress Notes (Signed)
[facility] Department STI clinic/screening visit  Subjective:  Rachel Parker is a 35 y.o. female being seen today for an STI screening visit. The patient reports they do have symptoms.  Patient reports that they do not desire a pregnancy in the next year.   They reported they are not interested in discussing contraception today.  Patient's last menstrual period was 02/05/2020.   Patient has the following medical conditions:  There are no problems to display for this patient.   Chief Complaint  Patient presents with  . SEXUALLY TRANSMITTED DISEASE    Screening     HPI  Patient reports having discharge, itching and odor x 3 days.    Last HIV test per patient/review of record was  11/2017 Patient reports last pap was 11/2017 and was negative   See flowsheet for further details and programmatic requirements.    The following portions of the patient's history were reviewed and updated as appropriate: allergies, current medications, past medical history, past social history, past surgical history and problem list.  Objective:  There were no vitals filed for this visit.  Physical Exam Vitals and nursing note reviewed.  Constitutional:      Appearance: Normal appearance.  HENT:     Head: Normocephalic and atraumatic.     Mouth/Throat:     Mouth: Mucous membranes are moist.     Pharynx: Oropharynx is clear. No oropharyngeal exudate or posterior oropharyngeal erythema.  Pulmonary:     Effort: Pulmonary effort is normal.  Chest:  Breasts:     Right: No axillary adenopathy or supraclavicular adenopathy.     Left: No axillary adenopathy or supraclavicular adenopathy.    Abdominal:     General: Abdomen is flat.     Palpations: There is no mass.     Tenderness: There is no abdominal tenderness. There is no rebound.  Genitourinary:    General: Normal vulva.     Exam position: Lithotomy position.     Pubic Area: No rash or pubic lice.      Labia:        Right:  No rash or lesion.        Left: No rash or lesion.      Vagina: Normal. No vaginal discharge, erythema, bleeding or lesions.     Cervix: No cervical motion tenderness, discharge, friability, lesion or erythema.     Uterus: Normal.      Adnexa: Right adnexa normal and left adnexa normal.     Rectum: Normal.     Comments: External genitalia without, lice, nits, erythema, edema , lesions or inguinal adenopathy. Vagina with normal mucosa and thick white discharge and pH >4 .  Cervix without visual lesions, uterus firm, mobile, non-tender, no masses, CMT adnexal fullness or tenderness.  Lymphadenopathy:     Head:     Right side of head: No preauricular or posterior auricular adenopathy.     Left side of head: No preauricular or posterior auricular adenopathy.     Cervical: No cervical adenopathy.     Upper Body:     Right upper body: No supraclavicular or axillary adenopathy.     Left upper body: No supraclavicular or axillary adenopathy.     Lower Body: No right inguinal adenopathy. No left inguinal adenopathy.  Skin:    General: Skin is warm and dry.     Findings: No rash.  Neurological:     Mental Status: She is alert and oriented to person, place, and time.  Psychiatric:  Mood and Affect: Mood normal.        Behavior: Behavior normal.      Assessment and Plan:  Rachel Parker is a 35 y.o. female presenting to the [facility] Department for STI screening  1. Screening examination for venereal disease  - HBV Antigen/Antibody [facility] Lab - HIV/HCV [place] [facility] - Chlamydia/Gonorrhea [place] [facility] - Syphilis Serology, [place] [facility] - WET PREP FOR TRICH, YEAST, CLUE  2. Yeast vaginitis Treat for yeast with Clotramiazole 1% vaginal cream x 7 days.  Patient to start ~3-4 days after taking medication for BV.  3. Bacterial vaginosis + amine and clue treat for BV  - metroNIDAZOLE (FLAGYL) 500 MG tablet; Take 1 tablet (500 mg total) by mouth 2 (two) times daily for 7  days.  Dispense: 14 tablet; Refill: 0  Patient accepted all screenings including wet prep, vaginal CT/GC and bloodwork for HIV/RPR. Patient meets criteria for HepB screening? Yes. Ordered? Yes Patient meets criteria for HepC screening? No. Ordered? Yes   Discussed time line for State Lab results and that patient will be called with positive results and encouraged patient to call if she had not heard in 2 weeks.  Counseled to return or seek care for continued or worsening symptoms Recommended condom use with all sex  Patient is currently using condoms  to prevent pregnancy.    Return for as needed.  No future appointments.  Wendi Snipes, FNP

## 2020-02-19 ENCOUNTER — Telehealth: Payer: Self-pay

## 2020-02-19 NOTE — Telephone Encounter (Signed)
Call to patient to inform of positive gonorrhea test result form visit 02/12/2020. No answer. LMOM to return call.   Harvie Heck, RN

## 2020-02-19 NOTE — Telephone Encounter (Signed)
From patient, RN informed her of positive gonorrhea result from 02/09/2020 visit. Patient scheduled for tx on tomorrow morning. Patient instructed no sex and to eat before arrival.   Harvie Heck, RN

## 2020-02-20 NOTE — Telephone Encounter (Signed)
Patient wants to know if medication can be send to a pharmacy due to canceling her appointment.

## 2020-02-21 ENCOUNTER — Other Ambulatory Visit: Payer: Self-pay

## 2020-02-21 ENCOUNTER — Ambulatory Visit: Payer: Self-pay | Admitting: Physician Assistant

## 2020-02-21 DIAGNOSIS — A549 Gonococcal infection, unspecified: Secondary | ICD-10-CM

## 2020-02-21 DIAGNOSIS — A5402 Gonococcal vulvovaginitis, unspecified: Secondary | ICD-10-CM

## 2020-02-21 MED ORDER — CEFTRIAXONE SODIUM 500 MG IJ SOLR
500.0000 mg | Freq: Once | INTRAMUSCULAR | Status: AC
  Administered 2020-02-21: 500 mg via INTRAMUSCULAR

## 2020-02-21 NOTE — Telephone Encounter (Signed)
Phone call to pt. Pt states she already has appt scheduled for this afternoon. No additional assistance needed at this time.

## 2020-02-21 NOTE — Progress Notes (Signed)
Patient into clinic for treatment only today.  RN treated patient for GC per standing order.

## 2020-02-21 NOTE — Progress Notes (Signed)
Pt to clinic for tx of GC, pt treated per standing order (GC+, Chlamydia ruled out).

## 2020-02-26 ENCOUNTER — Encounter: Payer: Self-pay | Admitting: Student

## 2020-02-26 LAB — HEPATITIS B SURFACE ANTIGEN: Hepatitis B Surface Ag: NONREACTIVE

## 2020-02-27 ENCOUNTER — Encounter: Payer: Self-pay | Admitting: Student

## 2020-02-27 LAB — HM HEPATITIS C SCREENING LAB: HM Hepatitis Screen: NEGATIVE

## 2020-02-27 LAB — HM HIV SCREENING LAB: HM HIV Screening: NEGATIVE

## 2020-05-24 ENCOUNTER — Encounter: Payer: Self-pay | Admitting: Physician Assistant

## 2020-05-24 ENCOUNTER — Ambulatory Visit: Payer: Self-pay | Admitting: Physician Assistant

## 2020-05-24 ENCOUNTER — Other Ambulatory Visit: Payer: Self-pay

## 2020-05-24 VITALS — BP 105/74 | Ht 65.0 in | Wt 194.2 lb

## 2020-05-24 DIAGNOSIS — B373 Candidiasis of vulva and vagina: Secondary | ICD-10-CM

## 2020-05-24 DIAGNOSIS — Z299 Encounter for prophylactic measures, unspecified: Secondary | ICD-10-CM

## 2020-05-24 DIAGNOSIS — Z30011 Encounter for initial prescription of contraceptive pills: Secondary | ICD-10-CM

## 2020-05-24 DIAGNOSIS — Z3009 Encounter for other general counseling and advice on contraception: Secondary | ICD-10-CM

## 2020-05-24 DIAGNOSIS — Z113 Encounter for screening for infections with a predominantly sexual mode of transmission: Secondary | ICD-10-CM

## 2020-05-24 DIAGNOSIS — B3731 Acute candidiasis of vulva and vagina: Secondary | ICD-10-CM

## 2020-05-24 LAB — WET PREP FOR TRICH, YEAST, CLUE: Trichomonas Exam: NEGATIVE

## 2020-05-24 MED ORDER — CLOTRIMAZOLE 1 % VA CREA: 1.0000 | TOPICAL_CREAM | Freq: Every day | VAGINAL | 0 refills | Status: AC

## 2020-05-24 MED ORDER — ACYCLOVIR 400 MG PO TABS: 400.0000 mg | ORAL_TABLET | Freq: Three times a day (TID) | ORAL | 0 refills | Status: AC

## 2020-05-24 MED ORDER — NORGESTIMATE-ETH ESTRADIOL 0.25-35 MG-MCG PO TABS: 1.0000 | ORAL_TABLET | Freq: Every day | ORAL | 2 refills | Status: DC

## 2020-05-24 NOTE — Progress Notes (Signed)
[facility] Department STI clinic/screening visit  Subjective:  Rachel Parker is a 35 y.o. female being seen today for an STI screening visit. The patient reports they do have symptoms.  Patient reports that they do not desire a pregnancy in the next year.   They reported they are interested in discussing contraception today.  No LMP recorded.   Patient has the following medical conditions:  There are no problems to display for this patient.   Chief Complaint  Patient presents with  . SEXUALLY TRANSMITTED DISEASE    screening     HPI  Patient reports that she has had some itching and a white discharge for about 2 weeks.  Also reports that she thinks she might have a tear in her tissue after she had sex last week.  Denies other symptoms, chronic conditions and regular medicines.  Patient reports that she had her last HIV test in 02/2020 and she also had a pap then as well.  Requests to start OCs since she has been having really bad cramping with her period for the last few months.  Reports that she was on OCs as a teen and did not have any problems with them at that time.     See flowsheet for further details and programmatic requirements.    The following portions of the patient's history were reviewed and updated as appropriate: allergies, current medications, past medical history, past social history, past surgical history and problem list.  Objective:   Vitals:   05/24/20 1054  BP: 105/74  Weight: 194 lb 3.2 oz (88.1 kg)  Height: 5\' 5"  (1.651 m)    Physical Exam Constitutional:      General: She is not in acute distress.    Appearance: Normal appearance.  HENT:     Head: Normocephalic and atraumatic.     Comments: No nits,lice, or hair loss. No cervical, supraclavicular or axillary adenopathy.    Mouth/Throat:     Mouth: Mucous membranes are moist.     Pharynx: Oropharynx is clear. No oropharyngeal exudate or posterior oropharyngeal erythema.  Eyes:      Conjunctiva/sclera: Conjunctivae normal.  Pulmonary:     Effort: Pulmonary effort is normal.  Abdominal:     Palpations: Abdomen is soft. There is no mass.     Tenderness: There is no abdominal tenderness. There is no guarding or rebound.  Genitourinary:    General: Normal vulva.     Rectum: Normal.     Comments: External genitalia/pubic area without nits, lice, edema, erythema, lesions and inguinal adenopathy. Perineal area and introitus with moderate erythema, edema and shallow ulcerative lesions, tender to culture. Vagina with dry mucosa and small amount of white discharge. Cervix without visible lesions. Uterus firm, mobile, nt, no masses, no CMT, no adnexal tenderness or fullness. Musculoskeletal:     Cervical back: Neck supple. No tenderness.  Skin:    General: Skin is warm and dry.     Findings: No bruising, erythema, lesion or rash.  Neurological:     Mental Status: She is alert and oriented to person, place, and time.  Psychiatric:        Mood and Affect: Mood normal.        Behavior: Behavior normal.        Thought Content: Thought content normal.        Judgment: Judgment normal.      Assessment and Plan:  Rachel Parker is a 35 y.o. female presenting to the [facility]  Department for STI screening  1. Screening for STD (sexually transmitted disease) Patient into clinic with symptoms. Patient declines blood work today, despite counseling that RPR should be done due to genital lesion. Rec condoms with all sex. Await test results.  Counseled that RN will call if needs to RTC for treatment once results are back. - WET PREP FOR TRICH, YEAST, CLUE - Chlamydia/Gonorrhea [place] [facility] - Gonococcus culture - Virology,  Lab  2. Encounter for counseling regarding contraception Reviewed with patient risks, benefits, and SE of OCs.  Counseled to continue with condoms with all sex for STD protection.  3. Prophylactic measure Will give Acyclovir 400 mg 1  po TID for 10 days. No sex for 10 days, until lesions have healed and results are back.  - acyclovir (ZOVIRAX) 400 MG tablet; Take 1 tablet (400 mg total) by mouth 3 (three) times daily for 10 days.  Dispense: 30 tablet; Refill: 0  4. OCP (oral contraceptive pills) initiation Rx for Sprintec 28 d take 1 po daily at the same time each day with refills x 2 sent to patient's pharmacy of choice. Patient counseled to start OCs at the onset of her next menses. - norgestimate-ethinyl estradiol (SPRINTEC 28) 0.25-35 MG-MCG tablet; Take 1 tablet by mouth daily.  Dispense: 28 tablet; Refill: 2  5. Candidal vulvovaginitis Treat yeast with Clotrimazole 1 % vaginal cream 1 app qhs for 7 days. No sex for 10 days. - clotrimazole (GYNE-LOTRIMIN) 1 % vaginal cream; Place 1 Applicatorful vaginally at bedtime for 7 days.  Dispense: 45 g; Refill: 0     Return in about 2 months (around 07/24/2020) for [facility] IP/RP to assess OCs.  No future appointments.  Matt Holmes, PA

## 2020-05-24 NOTE — Progress Notes (Signed)
Wet mount reviewed by provider, pt treated for yeast per standing order. Acyclovir dispensed per provider order. Provider orders completed.

## 2020-05-29 LAB — GONOCOCCUS CULTURE

## 2021-03-25 ENCOUNTER — Ambulatory Visit (LOCAL_COMMUNITY_HEALTH_CENTER): Payer: 59

## 2021-03-25 ENCOUNTER — Other Ambulatory Visit: Payer: Self-pay

## 2021-03-25 VITALS — BP 130/81 | Ht 65.0 in | Wt 198.0 lb

## 2021-03-25 DIAGNOSIS — Z3201 Encounter for pregnancy test, result positive: Secondary | ICD-10-CM

## 2021-03-25 LAB — PREGNANCY, URINE: Preg Test, Ur: POSITIVE — AB

## 2021-03-25 MED ORDER — PRENATAL 27-0.8 MG PO TABS: 1.0000 | ORAL_TABLET | Freq: Every day | ORAL | 0 refills | Status: AC

## 2021-03-25 NOTE — Progress Notes (Signed)
UPT positive. Plans prenatal care at [facility]. Plans to call to schedule [facility] appt as she needs to be at work now. RN counseled pt to call soon as pt has hx gestational diabetes (managed by diet)  and recent cocaine use (one month ago.) Pt verbalizes understanding. Josie Saunders, RN ? ?

## 2021-04-14 ENCOUNTER — Other Ambulatory Visit: Payer: Self-pay

## 2021-04-14 ENCOUNTER — Emergency Department
Admission: EM | Admit: 2021-04-14 | Discharge: 2021-04-14 | Disposition: A | Discharge status: Home / Self Care | Payer: 59 | Attending: Emergency Medicine | Admitting: Emergency Medicine

## 2021-04-14 ENCOUNTER — Emergency Department: Payer: 59

## 2021-04-14 DIAGNOSIS — Z3A09 9 weeks gestation of pregnancy: Secondary | ICD-10-CM | POA: Insufficient documentation

## 2021-04-14 DIAGNOSIS — Z6720 Type B blood, Rh positive: Secondary | ICD-10-CM | POA: Diagnosis not present

## 2021-04-14 DIAGNOSIS — O368910 Maternal care for other specified fetal problems, first trimester, not applicable or unspecified: Secondary | ICD-10-CM | POA: Insufficient documentation

## 2021-04-14 DIAGNOSIS — O99810 Abnormal glucose complicating pregnancy: Secondary | ICD-10-CM | POA: Insufficient documentation

## 2021-04-14 DIAGNOSIS — O209 Hemorrhage in early pregnancy, unspecified: Secondary | ICD-10-CM | POA: Diagnosis present

## 2021-04-14 DIAGNOSIS — O468X1 Other antepartum hemorrhage, first trimester: Secondary | ICD-10-CM

## 2021-04-14 LAB — CBC WITH DIFFERENTIAL/PLATELET
Abs Immature Granulocytes: 0.04 10*3/uL (ref 0.00–0.07)
Basophils Absolute: 0.1 10*3/uL (ref 0.0–0.1)
Basophils Relative: 1 %
Eosinophils Absolute: 0.2 10*3/uL (ref 0.0–0.5)
Eosinophils Relative: 2 %
HCT: 41.3 % (ref 36.0–46.0)
Hemoglobin: 14.3 g/dL (ref 12.0–15.0)
Immature Granulocytes: 0 %
Lymphocytes Relative: 11 %
Lymphs Abs: 1.1 10*3/uL (ref 0.7–4.0)
MCH: 31.7 pg (ref 26.0–34.0)
MCHC: 34.6 g/dL (ref 30.0–36.0)
MCV: 91.6 fL (ref 80.0–100.0)
Monocytes Absolute: 0.6 10*3/uL (ref 0.1–1.0)
Monocytes Relative: 6 %
Neutro Abs: 8.1 10*3/uL — ABNORMAL HIGH (ref 1.7–7.7)
Neutrophils Relative %: 80 %
Platelets: 240 10*3/uL (ref 150–400)
RBC: 4.51 MIL/uL (ref 3.87–5.11)
RDW: 12.2 % (ref 11.5–15.5)
WBC: 10.1 10*3/uL (ref 4.0–10.5)
nRBC: 0 % (ref 0.0–0.2)

## 2021-04-14 LAB — URINALYSIS, ROUTINE W REFLEX MICROSCOPIC
Bilirubin Urine: NEGATIVE
Glucose, UA: >=500 mg/dL — AB
Ketones, ur: 20 mg/dL — AB
Leukocytes,Ua: NEGATIVE
Nitrite: NEGATIVE
Protein, ur: 30 mg/dL — AB
RBC / HPF: >50 RBC/hpf — ABNORMAL HIGH (ref 0–5)
Specific Gravity, Urine: 1.022 (ref 1.005–1.030)
pH: 6 (ref 5.0–8.0)

## 2021-04-14 LAB — BASIC METABOLIC PANEL
Anion gap: 10 (ref 5–15)
BUN: 7 mg/dL (ref 6–20)
CO2: 23 mmol/L (ref 22–32)
Calcium: 8.4 mg/dL — ABNORMAL LOW (ref 8.9–10.3)
Chloride: 100 mmol/L (ref 98–111)
Creatinine, Ser: 0.37 mg/dL — ABNORMAL LOW (ref 0.44–1.00)
GFR, Estimated: >60 mL/min (ref >60)
Glucose, Bld: 212 mg/dL — ABNORMAL HIGH (ref 70–99)
Potassium: 3.7 mmol/L (ref 3.5–5.1)
Sodium: 133 mmol/L — ABNORMAL LOW (ref 135–145)

## 2021-04-14 LAB — ABO/RH: ABO/RH(D): B POS

## 2021-04-14 LAB — BRAIN NATRIURETIC PEPTIDE: B Natriuretic Peptide: 11.8 pg/mL (ref 0.0–100.0)

## 2021-04-14 LAB — HCG, QUANTITATIVE, PREGNANCY: hCG, Beta Chain, Quant, S: 60974 m[IU]/mL — ABNORMAL HIGH (ref <5)

## 2021-04-14 NOTE — ED Notes (Signed)
See triage note  presents with some vaginal bleeding which started this am  states she is about 9.[redacted] weeks pregnant ?

## 2021-04-14 NOTE — ED Triage Notes (Signed)
Pt states she is about 3 months pregnant and after having intercourse this morning noticed blood when she wipes, states she had a UTI about 2 weeks ago ?

## 2021-04-14 NOTE — ED Provider Notes (Signed)
[facility] ?Emergency Department Provider Note ? ?____________________________________________ ? ?Time seen: Approximately 11:08 AM ? ?I have reviewed the triage vital signs and the nursing notes. ? ? ?HISTORY ? ?Chief Complaint ?Vaginal Bleeding ? ? ? ?HPI ?Rachel Parker is a 36 y.o. female with no significant past medical history presents to the emergency department for treatment and evaluation of vaginal bleeding. She is about 3 months pregnant. After intercourse this morning, she noticed bleeding when she wiped. No pelvic cramping. She has not passed clots or tissue. ? ?Allergies ?Other ?____________________________________________ ? ? ?PHYSICAL EXAM: ? ?VITAL SIGNS: ?ED Triage Vitals [04/14/21 1057]  ?Enc Vitals Group  ?   BP 124/80  ?   Pulse Rate (!) 104  ?   Resp 18  ?   Temp 98.4 ?F (36.9 ?C)  ?   Temp Source Oral  ?   SpO2 90 %  ?   Weight   ?   Height   ?   Head Circumference   ?   Peak Flow   ?   Pain Score   ?   Pain Loc   ?   Pain Edu?   ?   Excl. in GC?   ? ? ?Constitutional: Alert and oriented. Well appearing and in no acute distress. ?Gastrointestinal: Abdomen is soft without rebound or guarding. ?Genitourinary: Pelvic exam: deferred ?Musculoskeletal: No extremity tenderness nor edema.  ?Neurologic:  Normal speech and language. No gross focal neurologic deficits are appreciated. Speech is normal. No gait instability. ?Skin:  Skin is warm, dry and intact. No rash noted on exposed skin. ?Psychiatric: Mood and affect are normal. Speech and behavior are normal. ? ?____________________________________________ ?  ?LABS ?(all labs ordered are listed, but only abnormal results are displayed) ? ?Labs Reviewed  ?CBC WITH DIFFERENTIAL/PLATELET - Abnormal; Notable for the following components:  ?    Result Value  ? Neutro Abs 8.1 (*)   ? All other components within normal limits  ?URINALYSIS, ROUTINE W REFLEX MICROSCOPIC - Abnormal; Notable for the following components:  ? Color, Urine  YELLOW (*)   ? APPearance HAZY (*)   ? Glucose, UA >=500 (*)   ? Hgb urine dipstick LARGE (*)   ? Ketones, ur 20 (*)   ? Protein, ur 30 (*)   ? RBC / HPF >50 (*)   ? Bacteria, UA RARE (*)   ? All other components within normal limits  ?HCG, QUANTITATIVE, PREGNANCY - Abnormal; Notable for the following components:  ? hCG, Beta Francene Finders 97,026 (*)   ? All other components within normal limits  ?BASIC METABOLIC PANEL - Abnormal; Notable for the following components:  ? Sodium 133 (*)   ? Glucose, Bld 212 (*)   ? Creatinine, Ser 0.37 (*)   ? Calcium 8.4 (*)   ? All other components within normal limits  ?BRAIN NATRIURETIC PEPTIDE  ?ABO/RH  ?ABO/RH  ? ?____________________________________________ ? ?RADIOLOGY ? ?OB US showing single IUP 9 weeks 1 day with  FHR of 180. Small subchorionic hemorrhage present.  ? ?Images and radiology report reviewed by me. ?____________________________________________ ? ?Procedures ? ?____________________________________________ ? ? ?INITIAL IMPRESSION / ASSESSMENT AND PLAN / ED COURSE ? ?36 year old female presenting to the ER for evaluation of vaginal bleeding in pregnancy. See HPI.  ? ?US shows single IUP with FHR of 180. Glucose slightly elevated to 212 with glucosuria of >500. CBC is unremarkable. Blood type is B positive.  ?(I have requested lab to remove  charge for BNP as it was not indicated for today's visit.) ? ?Patient states with her previous pregnancies, she has had gestational diabetes. She was encouraged to restrict carbohydrates and maintain a diabetic diet. Results of [place] discussed with patient and significant other. Pelvic rest encouraged until bleeding has completely stopped. She is to follow up with OB/GYN as scheduled. She is to return to the ER for symptoms of concern . ? ? ? ?Differential diagnosis includes, but not limited to:  ? ?Pertinent labs & imaging results that were available during my care of the patient were reviewed by me and considered in my medical  decision making (see chart for details). ? ?____________________________________________ ? ? ?FINAL CLINICAL IMPRESSION(S) / ED DIAGNOSES ? ?Final diagnoses:  ?Subchorionic hematoma in first trimester, single or unspecified fetus  ? ? ?Note:  This document was prepared using Dragon voice recognition software and may include unintentional dictation errors. ? ?  ?Chinita Pester, FNP ?04/15/21 0805 ? ?  ?Jene Every, MD ?04/15/21 1606 ? ?

## 2021-05-06 ENCOUNTER — Telehealth: Payer: Self-pay | Admitting: Family Medicine

## 2021-05-06 NOTE — Telephone Encounter (Signed)
Patient called in earlier stating that needs treatment for syphilis asap. She is currently a maternity patient at [facility]. ?

## 2021-05-06 NOTE — Telephone Encounter (Signed)
Phone call to pt at [phone]. Pt states she is pregnant and needs tx for syphilis. ?Pt scheduled with provider for 05/07/21 appt.  Pt counseled to inquire before leaving appt on 05/07/21, if she needs to RTC for the following two Wednesdays for two more treatments, may want to go ahead and schedule if ordered by provider. ? ?Pt has non reactive RPR from 02/09/20 testing at [facility]. ?05/01/21 testing at [facility] was reactive at 1:4. ? ?Pt states she does not remember getting any other syphilis testing done between 02/09/20 and 05/01/21. ? ?Pt counseled to expect call from DIS. ? ?Phone call to Elfredia Nevins, DIS. He is aware of results but not receiving calls back about this pt from other sources.  Denyse Amass informed about previous test results and provider/tx appt scheduled for 05/06/21. ?

## 2021-05-07 ENCOUNTER — Ambulatory Visit: Payer: Self-pay | Admitting: Advanced Practice Midwife

## 2021-05-07 ENCOUNTER — Encounter: Payer: Self-pay | Admitting: Advanced Practice Midwife

## 2021-05-07 DIAGNOSIS — F1491 Cocaine use, unspecified, in remission: Secondary | ICD-10-CM | POA: Insufficient documentation

## 2021-05-07 DIAGNOSIS — F1490 Cocaine use, unspecified, uncomplicated: Secondary | ICD-10-CM

## 2021-05-07 DIAGNOSIS — T7412XS Child physical abuse, confirmed, sequela: Secondary | ICD-10-CM

## 2021-05-07 DIAGNOSIS — O99311 Alcohol use complicating pregnancy, first trimester: Secondary | ICD-10-CM | POA: Insufficient documentation

## 2021-05-07 DIAGNOSIS — Z113 Encounter for screening for infections with a predominantly sexual mode of transmission: Secondary | ICD-10-CM

## 2021-05-07 DIAGNOSIS — F1290 Cannabis use, unspecified, uncomplicated: Secondary | ICD-10-CM | POA: Insufficient documentation

## 2021-05-07 DIAGNOSIS — O98119 Syphilis complicating pregnancy, unspecified trimester: Secondary | ICD-10-CM | POA: Insufficient documentation

## 2021-05-07 DIAGNOSIS — O99320 Drug use complicating pregnancy, unspecified trimester: Secondary | ICD-10-CM

## 2021-05-07 DIAGNOSIS — T7412XA Child physical abuse, confirmed, initial encounter: Secondary | ICD-10-CM | POA: Insufficient documentation

## 2021-05-07 HISTORY — DX: Alcohol use complicating pregnancy, first trimester: O99.311

## 2021-05-07 MED ORDER — PENICILLIN G BENZATHINE 1200000 UNIT/2ML IM SUSY
1.2000 10*6.[IU] | PREFILLED_SYRINGE | Freq: Once | INTRAMUSCULAR | Status: AC
  Administered 2021-05-07: 1.2 10*6.[IU] via INTRAMUSCULAR

## 2021-05-07 NOTE — Progress Notes (Signed)
Patient given Bicillin shots. Patient instructed to stay for 15 minutes. Patient instructed to make a follow appt in 7-10 days.  ?

## 2021-05-07 NOTE — Progress Notes (Signed)
[facility] Department ? ?STI clinic/screening visit ?[address] ?[place] [address] ?[phone] ? ?Subjective:  ?Rachel Parker is a 36 y.o. SWF pregnant exsmoker G3P1 female being seen today for an STI screening visit. The patient reports they do not have symptoms.  Patient reports that they do desire a pregnancy in the next year.   They reported they are not interested in discussing contraception today.   ? ?Patient's last menstrual period was 02/06/2021 (within days). ? ? ?Patient has the following medical conditions:   ?Patient Active Problem List  ? Diagnosis Date Noted  ? Syphilis affecting pregnancy 05/07/2021  ? Morbid obesity (HCC) 05/07/2021  ? ? ?Chief Complaint  ?Patient presents with  ? SEXUALLY TRANSMITTED DISEASE  ?  Screening  ? ? ?HPI ? ?Patient reports she is pregnant and was sent here by [facility] after her first OB exam on 05/01/21 because she needs tx for syphilis. RPR NR 02/09/20 at [facility]. RPR reactive 1:4 on 05/01/21 at [facility]. LMP 02/07/21. Last sex 05/03/21 without condom; with current partner x 6 mo; 1 partner in last 3 mo. Pt denies sxs.  Last MJ 02/2021. Last cocaine 02/2021. Last ETOH 03/21/21 (1 bottle Tequila). Last cig 01/04/21. Last vaped 4 years ago.  ? ?Last HIV test per patient/review of record was 05/01/21 ?Patient reports last pap was pt doesn't know and not in Epic records ? ?Screening for MPX risk: ?Does the patient have an unexplained rash? No ?Is the patient MSM? No ?Does the patient endorse multiple sex partners or anonymous sex partners? No ?Did the patient have close or sexual contact with a person diagnosed with MPX? No ?Has the patient traveled outside the [place] where MPX is endemic? No ?Is there a high clinical suspicion for MPX-- evidenced by one of the following No ? -Unlikely to be chickenpox ? -Lymphadenopathy ? -Rash that present in same phase of evolution on any given body part ?See flowsheet for further details and programmatic requirements.  ? ? ?The  following portions of the patient's history were reviewed and updated as appropriate: allergies, current medications, past medical history, past social history, past surgical history and problem list. ? ?Objective:  ?There were no vitals filed for this visit. ? ?Physical Exam ?Vitals and nursing note reviewed.  ?Constitutional:   ?   Appearance: Normal appearance. She is obese.  ?HENT:  ?   Head: Normocephalic and atraumatic.  ?   Mouth/Throat:  ?   Mouth: Mucous membranes are moist.  ?   Pharynx: Oropharynx is clear. No oropharyngeal exudate or posterior oropharyngeal erythema.  ?Eyes:  ?   Conjunctiva/sclera: Conjunctivae normal.  ?Pulmonary:  ?   Effort: Pulmonary effort is normal.  ?Abdominal:  ?   Palpations: There is no mass.  ?   Tenderness: There is no abdominal tenderness. There is no rebound.  ?Genitourinary: ?   Pubic Area: No rash or pubic lice.   ?   Labia:     ?   Right: No rash or lesion.     ?   Left: No rash or lesion.   ?   Vagina: No vaginal discharge, erythema, bleeding or lesions.  ?   Cervix: No cervical motion tenderness, discharge, friability, lesion or erythema.  ?   Comments: Pt declines exam as had at [facility] on 05/01/21 ?Lymphadenopathy:  ?   Head:  ?   Right side of head: No preauricular or posterior auricular adenopathy.  ?   Left side of head:  No preauricular or posterior auricular adenopathy.  ?   Cervical: No cervical adenopathy.  ?   Upper Body:  ?   Right upper body: No supraclavicular or axillary adenopathy.  ?   Left upper body: No supraclavicular or axillary adenopathy.  ?   Lower Body: No right inguinal adenopathy. No left inguinal adenopathy.  ?Skin: ?   General: Skin is warm and dry.  ?   Findings: No rash.  ?Neurological:  ?   Mental Status: She is alert and oriented to person, place, and time.  ? ? ? ?Assessment and Plan:  ?Rachel Parker is a 36 y.o. female presenting to the [facility] Department for STI screening ? ?1. Syphilis affecting pregnancy, antepartum ?RPR  to be drawn today ?Treat for syphilis per standing orders ?Please assist pt with scheduling apt in 7 days for tx x3 total ?- Syphilis Serology, [place] [facility] ? ?2. Screening examination for venereal disease ?Pt declines exam as had at [facility] 05/01/21 ? ?3. Morbid obesity (HCC) ? ? ? ? ? ?No follow-ups on file. ? ?No future appointments. ? ?Alberteen Spindle, CNM ?

## 2021-05-08 NOTE — Telephone Encounter (Signed)
Call pt to schedule additional syphilis tx for 05/14/21 and 05/21/21.  ?See Wallis Mart, CNM, order for total of 3 tx dated 05/07/21 (received 1st set of Bicillin on 05/07/21). ?

## 2021-05-13 ENCOUNTER — Other Ambulatory Visit: Payer: Self-pay | Admitting: Certified Nurse Midwife

## 2021-05-13 DIAGNOSIS — Z3689 Encounter for other specified antenatal screening: Secondary | ICD-10-CM

## 2021-05-13 NOTE — Telephone Encounter (Signed)
05/07/21 syphilis result (non-reactive) printed to [facility] printer, for Donnal Moat, CNM to review and advise. ? ?

## 2021-05-15 ENCOUNTER — Ambulatory Visit: Payer: 59 | Admitting: Family Medicine

## 2021-05-15 DIAGNOSIS — A539 Syphilis, unspecified: Secondary | ICD-10-CM | POA: Insufficient documentation

## 2021-05-15 MED ORDER — PENICILLIN G BENZATHINE 1200000 UNIT/2ML IM SUSY
1.2000 10*6.[IU] | PREFILLED_SYRINGE | INTRAMUSCULAR | Status: AC
  Administered 2021-05-15 – 2021-05-22 (×3): 1.2 10*6.[IU] via INTRAMUSCULAR

## 2021-05-15 NOTE — Progress Notes (Signed)
This patient was not seen by provider  ? ?Junious Dresser, FNP  ?

## 2021-05-15 NOTE — Progress Notes (Signed)
Pt here for #2 treatment for Syphilis.  Bicillin 2.4 MU given IM in without any complications.  Pt will return next week for #3 treatment.  Windle Guard, RN ? ?

## 2021-05-20 ENCOUNTER — Other Ambulatory Visit: Payer: Self-pay | Admitting: Certified Nurse Midwife

## 2021-05-20 DIAGNOSIS — O24112 Pre-existing diabetes mellitus, type 2, in pregnancy, second trimester: Secondary | ICD-10-CM

## 2021-05-20 DIAGNOSIS — O09522 Supervision of elderly multigravida, second trimester: Secondary | ICD-10-CM

## 2021-05-22 ENCOUNTER — Ambulatory Visit: Payer: Self-pay | Admitting: Family Medicine

## 2021-05-22 DIAGNOSIS — A539 Syphilis, unspecified: Secondary | ICD-10-CM

## 2021-05-22 MED ORDER — PENICILLIN G BENZATHINE 1200000 UNIT/2ML IM SUSY
1.2000 10*6.[IU] | PREFILLED_SYRINGE | Freq: Once | INTRAMUSCULAR | Status: AC
  Administered 2021-05-15: 1.2 10*6.[IU] via INTRAMUSCULAR

## 2021-05-22 NOTE — Addendum Note (Signed)
Addended by: Wendi Snipes on: 05/22/2021 04:24 PM   Modules accepted: Orders

## 2021-05-22 NOTE — Progress Notes (Signed)
Pt here for 3rd treatment of Syphilis.  Bicillin 2.4 MU given IM without any complications.  Pt notified to return in 6 months to be retested.  Berdie Ogren, RN

## 2021-05-22 NOTE — Progress Notes (Signed)
Late entry: Added order for additional 1.2 million units of Bicilin that was given on 05/15/21.    Wendi Snipes, FNP

## 2021-05-22 NOTE — Progress Notes (Signed)
I was consulted about this client's syphilis treatment plan.

## 2021-05-28 NOTE — Telephone Encounter (Signed)
Pt completed 3rd set of injections; ordered tx completed.

## 2021-06-05 ENCOUNTER — Ambulatory Visit: Payer: 59

## 2021-07-03 ENCOUNTER — Ambulatory Visit (HOSPITAL_BASED_OUTPATIENT_CLINIC_OR_DEPARTMENT_OTHER): Payer: 59 | Admitting: Obstetrics and Gynecology

## 2021-07-03 ENCOUNTER — Ambulatory Visit: Payer: 59 | Attending: Obstetrics and Gynecology

## 2021-07-03 ENCOUNTER — Other Ambulatory Visit: Payer: Self-pay

## 2021-07-03 DIAGNOSIS — Z3A21 21 weeks gestation of pregnancy: Secondary | ICD-10-CM | POA: Diagnosis not present

## 2021-07-03 DIAGNOSIS — E118 Type 2 diabetes mellitus with unspecified complications: Secondary | ICD-10-CM | POA: Insufficient documentation

## 2021-07-03 DIAGNOSIS — E669 Obesity, unspecified: Secondary | ICD-10-CM | POA: Diagnosis not present

## 2021-07-03 DIAGNOSIS — Z87891 Personal history of nicotine dependence: Secondary | ICD-10-CM | POA: Insufficient documentation

## 2021-07-03 DIAGNOSIS — O24312 Unspecified pre-existing diabetes mellitus in pregnancy, second trimester: Secondary | ICD-10-CM

## 2021-07-03 DIAGNOSIS — O99212 Obesity complicating pregnancy, second trimester: Secondary | ICD-10-CM | POA: Diagnosis not present

## 2021-07-03 DIAGNOSIS — O24112 Pre-existing diabetes mellitus, type 2, in pregnancy, second trimester: Secondary | ICD-10-CM | POA: Insufficient documentation

## 2021-07-03 DIAGNOSIS — O09522 Supervision of elderly multigravida, second trimester: Secondary | ICD-10-CM | POA: Insufficient documentation

## 2021-07-03 DIAGNOSIS — O09212 Supervision of pregnancy with history of pre-term labor, second trimester: Secondary | ICD-10-CM

## 2021-07-03 DIAGNOSIS — Z363 Encounter for antenatal screening for malformations: Secondary | ICD-10-CM | POA: Diagnosis not present

## 2021-07-03 NOTE — Progress Notes (Signed)
Maternal-Fetal Medicine   Name: Julina Altmann DOB: 1985/10/28 MRN: [id number] Referring Provider: Donato Schultz, CNM  I had the pleasure of seeing Ms. Crompton today at [facility], [facility].  She is G3 P1011 at 21-weeks' gestation and is here for fetal anatomy scan and consultation.  Patient has type 2 diabetes.  Early pregnancy hemoglobin A1C  (April 2023) was 9.7% and the most recent hemoglobin A1c (06/25/21) was 8%.  Patient was prescribed insulin at your office and she will be picking up the prescription.  She has not started checking her blood glucose regularly but reports her fasting levels are below 95 mg/DL.  She does not have hypertension or any other chronic medical conditions.  Past surgical history: Tonsillectomy. Allergies: No known drug allergies. Medications: Insulin, prenatal vitamins. Social history: Patient reports she quit smoking cigarettes in January 2023.  No history of alcohol or drug use.  Her partner is African-American and he has sickle cell trait.  He is otherwise in good health. Family history: Mother has COPD.  Father had mild stroke but recovered.  Prenatal course: On cell-free fetal DNA screening, the risks of fetal aneuploidies are not increased.  MSAFP screening showed low risk for open neural tube defects.  Ultrasound We performed a fetal anatomical survey.  Amniotic fluid is normal and good fetal activity seen.  Fetal biometry is consistent with the previously established dates.  No markers of aneuploidies or fetal structural defects are seen.  Cardiac anatomy could not be assessed because of fetal position.  Type 2 diabetes in pregnancy -I counseled the patient on the effects of poorly controlled diabetes on pregnancy.  Increase in congenital malformation is expected with increased hemoglobin A1c of 9.7% (up to 7% congenital anomalies).  -In poorly controlled diabetes, fetal macrosomia can lead to birth injuries including Erbs palsy.  Stillbirths  occur more frequently and poorly controlled diabetes.  Neonatal complications include hypoglycemia, hyperbilirubinemia and respiratory distress syndrome. -I emphasized the importance of good blood glucose control.  I discussed the parameters.  She should aim to keep her fasting levels under 95 MGs/DL and postprandial levels less than 120 MGs/DL.  -I recommended fetal echocardiography to rule out cardiac malformations. -I discussed ultrasound protocol of serial fetal growth measurements, and weekly antenatal testing from [redacted] weeks gestation.  -If diabetes is well controlled, she can be delivered at [redacted] weeks gestation.  Early term delivery (37 to 39 weeks) is reasonable if diabetes is not well controlled. -I reassured her that vaginal delivery is not contraindicated.  Recommendations -Patient was advised to bring her blood glucose log to our office at her next visit. -An appointment was made for her to return in 4 weeks for completion of fetal anatomy. -We have requested an appointment for fetal echocardiography ([facility]). -Fetal growth assessments every 4 weeks. -Weekly BPP from [redacted] weeks gestation till delivery. -Delivery at [redacted] weeks gestation provided diabetes is well controlled. -Ophthalmology examination to rule out proliferative retinopathy may be considered.  Thank you for consultation.  If you have any questions or concerns, please contact me the [facility].  Consultation including face-to-face counseling (more than 50% of time spent) is 30 minutes.

## 2021-07-29 ENCOUNTER — Other Ambulatory Visit: Payer: Self-pay

## 2021-07-29 DIAGNOSIS — O09522 Supervision of elderly multigravida, second trimester: Secondary | ICD-10-CM

## 2021-07-29 DIAGNOSIS — O24312 Unspecified pre-existing diabetes mellitus in pregnancy, second trimester: Secondary | ICD-10-CM

## 2021-07-31 ENCOUNTER — Ambulatory Visit: Payer: 59 | Attending: Obstetrics

## 2021-07-31 ENCOUNTER — Other Ambulatory Visit: Payer: Self-pay

## 2021-07-31 DIAGNOSIS — E119 Type 2 diabetes mellitus without complications: Secondary | ICD-10-CM

## 2021-07-31 DIAGNOSIS — Z3A25 25 weeks gestation of pregnancy: Secondary | ICD-10-CM

## 2021-07-31 DIAGNOSIS — E669 Obesity, unspecified: Secondary | ICD-10-CM | POA: Diagnosis not present

## 2021-07-31 DIAGNOSIS — O09522 Supervision of elderly multigravida, second trimester: Secondary | ICD-10-CM | POA: Insufficient documentation

## 2021-07-31 DIAGNOSIS — O99212 Obesity complicating pregnancy, second trimester: Secondary | ICD-10-CM

## 2021-07-31 DIAGNOSIS — O24312 Unspecified pre-existing diabetes mellitus in pregnancy, second trimester: Secondary | ICD-10-CM | POA: Insufficient documentation

## 2021-07-31 DIAGNOSIS — O24112 Pre-existing diabetes mellitus, type 2, in pregnancy, second trimester: Secondary | ICD-10-CM

## 2021-08-26 ENCOUNTER — Other Ambulatory Visit: Payer: Self-pay

## 2021-08-26 DIAGNOSIS — O09522 Supervision of elderly multigravida, second trimester: Secondary | ICD-10-CM

## 2021-08-26 DIAGNOSIS — O24312 Unspecified pre-existing diabetes mellitus in pregnancy, second trimester: Secondary | ICD-10-CM

## 2021-08-28 ENCOUNTER — Ambulatory Visit: Payer: 59 | Attending: Obstetrics

## 2021-08-28 ENCOUNTER — Other Ambulatory Visit: Payer: Self-pay

## 2021-08-28 DIAGNOSIS — O99213 Obesity complicating pregnancy, third trimester: Secondary | ICD-10-CM | POA: Diagnosis not present

## 2021-08-28 DIAGNOSIS — E669 Obesity, unspecified: Secondary | ICD-10-CM | POA: Insufficient documentation

## 2021-08-28 DIAGNOSIS — Z3A29 29 weeks gestation of pregnancy: Secondary | ICD-10-CM | POA: Insufficient documentation

## 2021-08-28 DIAGNOSIS — O24113 Pre-existing diabetes mellitus, type 2, in pregnancy, third trimester: Secondary | ICD-10-CM | POA: Diagnosis not present

## 2021-08-28 DIAGNOSIS — E119 Type 2 diabetes mellitus without complications: Secondary | ICD-10-CM | POA: Diagnosis not present

## 2021-08-28 DIAGNOSIS — O24419 Gestational diabetes mellitus in pregnancy, unspecified control: Secondary | ICD-10-CM | POA: Insufficient documentation

## 2021-08-28 DIAGNOSIS — O24312 Unspecified pre-existing diabetes mellitus in pregnancy, second trimester: Secondary | ICD-10-CM

## 2021-08-28 DIAGNOSIS — O403XX0 Polyhydramnios, third trimester, not applicable or unspecified: Secondary | ICD-10-CM | POA: Diagnosis not present

## 2021-08-28 DIAGNOSIS — Z794 Long term (current) use of insulin: Secondary | ICD-10-CM

## 2021-08-28 DIAGNOSIS — O09522 Supervision of elderly multigravida, second trimester: Secondary | ICD-10-CM

## 2021-09-16 ENCOUNTER — Other Ambulatory Visit: Payer: Self-pay

## 2021-09-16 DIAGNOSIS — O09522 Supervision of elderly multigravida, second trimester: Secondary | ICD-10-CM

## 2021-09-16 DIAGNOSIS — O24313 Unspecified pre-existing diabetes mellitus in pregnancy, third trimester: Secondary | ICD-10-CM

## 2021-09-18 ENCOUNTER — Ambulatory Visit: Payer: 59 | Attending: Obstetrics

## 2021-09-18 ENCOUNTER — Other Ambulatory Visit: Payer: Self-pay

## 2021-09-18 DIAGNOSIS — Z3A32 32 weeks gestation of pregnancy: Secondary | ICD-10-CM | POA: Diagnosis not present

## 2021-09-18 DIAGNOSIS — O09522 Supervision of elderly multigravida, second trimester: Secondary | ICD-10-CM

## 2021-09-18 DIAGNOSIS — O24113 Pre-existing diabetes mellitus, type 2, in pregnancy, third trimester: Secondary | ICD-10-CM

## 2021-09-18 DIAGNOSIS — O403XX0 Polyhydramnios, third trimester, not applicable or unspecified: Secondary | ICD-10-CM | POA: Diagnosis not present

## 2021-09-18 DIAGNOSIS — O99213 Obesity complicating pregnancy, third trimester: Secondary | ICD-10-CM | POA: Diagnosis not present

## 2021-09-18 DIAGNOSIS — O24313 Unspecified pre-existing diabetes mellitus in pregnancy, third trimester: Secondary | ICD-10-CM | POA: Insufficient documentation

## 2021-09-23 ENCOUNTER — Other Ambulatory Visit: Payer: Self-pay

## 2021-09-23 DIAGNOSIS — O09523 Supervision of elderly multigravida, third trimester: Secondary | ICD-10-CM

## 2021-09-23 DIAGNOSIS — O24319 Unspecified pre-existing diabetes mellitus in pregnancy, unspecified trimester: Secondary | ICD-10-CM

## 2021-09-23 DIAGNOSIS — O99213 Obesity complicating pregnancy, third trimester: Secondary | ICD-10-CM

## 2021-09-23 DIAGNOSIS — O403XX0 Polyhydramnios, third trimester, not applicable or unspecified: Secondary | ICD-10-CM

## 2021-09-23 DIAGNOSIS — O24013 Pre-existing diabetes mellitus, type 1, in pregnancy, third trimester: Secondary | ICD-10-CM

## 2021-09-25 ENCOUNTER — Ambulatory Visit: Payer: 59 | Attending: Obstetrics

## 2021-09-25 ENCOUNTER — Other Ambulatory Visit: Payer: Self-pay

## 2021-09-25 DIAGNOSIS — E108 Type 1 diabetes mellitus with unspecified complications: Secondary | ICD-10-CM | POA: Diagnosis not present

## 2021-09-25 DIAGNOSIS — O99213 Obesity complicating pregnancy, third trimester: Secondary | ICD-10-CM

## 2021-09-25 DIAGNOSIS — O24313 Unspecified pre-existing diabetes mellitus in pregnancy, third trimester: Secondary | ICD-10-CM

## 2021-09-25 DIAGNOSIS — Z3A33 33 weeks gestation of pregnancy: Secondary | ICD-10-CM | POA: Diagnosis not present

## 2021-09-25 DIAGNOSIS — O09523 Supervision of elderly multigravida, third trimester: Secondary | ICD-10-CM | POA: Insufficient documentation

## 2021-09-25 DIAGNOSIS — O403XX0 Polyhydramnios, third trimester, not applicable or unspecified: Secondary | ICD-10-CM

## 2021-09-25 DIAGNOSIS — E669 Obesity, unspecified: Secondary | ICD-10-CM | POA: Diagnosis not present

## 2021-09-25 DIAGNOSIS — O24013 Pre-existing diabetes mellitus, type 1, in pregnancy, third trimester: Secondary | ICD-10-CM | POA: Insufficient documentation

## 2021-09-30 ENCOUNTER — Other Ambulatory Visit: Payer: 59

## 2021-10-02 ENCOUNTER — Ambulatory Visit: Payer: 59

## 2021-10-02 ENCOUNTER — Ambulatory Visit: Payer: 59 | Attending: Obstetrics and Gynecology

## 2021-10-02 ENCOUNTER — Other Ambulatory Visit: Payer: Self-pay

## 2021-10-02 DIAGNOSIS — O09523 Supervision of elderly multigravida, third trimester: Secondary | ICD-10-CM | POA: Diagnosis not present

## 2021-10-02 DIAGNOSIS — Z794 Long term (current) use of insulin: Secondary | ICD-10-CM | POA: Diagnosis not present

## 2021-10-02 DIAGNOSIS — O403XX0 Polyhydramnios, third trimester, not applicable or unspecified: Secondary | ICD-10-CM | POA: Insufficient documentation

## 2021-10-02 DIAGNOSIS — Z3A34 34 weeks gestation of pregnancy: Secondary | ICD-10-CM | POA: Insufficient documentation

## 2021-10-02 DIAGNOSIS — O24313 Unspecified pre-existing diabetes mellitus in pregnancy, third trimester: Secondary | ICD-10-CM

## 2021-10-02 DIAGNOSIS — E669 Obesity, unspecified: Secondary | ICD-10-CM

## 2021-10-02 DIAGNOSIS — O24113 Pre-existing diabetes mellitus, type 2, in pregnancy, third trimester: Secondary | ICD-10-CM | POA: Diagnosis not present

## 2021-10-02 DIAGNOSIS — O99213 Obesity complicating pregnancy, third trimester: Secondary | ICD-10-CM | POA: Insufficient documentation

## 2021-10-07 ENCOUNTER — Ambulatory Visit: Payer: 59

## 2021-10-07 ENCOUNTER — Other Ambulatory Visit: Payer: 59

## 2021-10-09 ENCOUNTER — Other Ambulatory Visit: Payer: Self-pay

## 2021-10-09 DIAGNOSIS — O99213 Obesity complicating pregnancy, third trimester: Secondary | ICD-10-CM

## 2021-10-09 DIAGNOSIS — O403XX0 Polyhydramnios, third trimester, not applicable or unspecified: Secondary | ICD-10-CM

## 2021-10-09 DIAGNOSIS — O24313 Unspecified pre-existing diabetes mellitus in pregnancy, third trimester: Secondary | ICD-10-CM

## 2021-10-09 DIAGNOSIS — O09523 Supervision of elderly multigravida, third trimester: Secondary | ICD-10-CM

## 2021-10-14 ENCOUNTER — Other Ambulatory Visit: Payer: 59

## 2021-10-14 NOTE — Progress Notes (Deleted)
Pt was a "No Show" for her MFM @ [facility] today.

## 2021-10-21 ENCOUNTER — Other Ambulatory Visit: Payer: 59

## 2021-10-28 ENCOUNTER — Other Ambulatory Visit: Payer: 59

## 2021-11-13 ENCOUNTER — Emergency Department
Admission: EM | Admit: 2021-11-13 | Discharge: 2021-11-13 | Disposition: A | Discharge status: Other Acute Inpatient Hospital | Payer: 59 | Attending: Emergency Medicine | Admitting: Emergency Medicine

## 2021-11-13 ENCOUNTER — Emergency Department: Payer: 59

## 2021-11-13 ENCOUNTER — Other Ambulatory Visit: Payer: Self-pay

## 2021-11-13 DIAGNOSIS — N939 Abnormal uterine and vaginal bleeding, unspecified: Secondary | ICD-10-CM | POA: Diagnosis not present

## 2021-11-13 DIAGNOSIS — D72829 Elevated white blood cell count, unspecified: Secondary | ICD-10-CM | POA: Diagnosis not present

## 2021-11-13 DIAGNOSIS — T8149XA Infection following a procedure, other surgical site, initial encounter: Secondary | ICD-10-CM | POA: Diagnosis not present

## 2021-11-13 DIAGNOSIS — R1030 Lower abdominal pain, unspecified: Secondary | ICD-10-CM | POA: Diagnosis present

## 2021-11-13 DIAGNOSIS — E871 Hypo-osmolality and hyponatremia: Secondary | ICD-10-CM | POA: Insufficient documentation

## 2021-11-13 LAB — URINALYSIS, ROUTINE W REFLEX MICROSCOPIC
Bilirubin Urine: NEGATIVE
Glucose, UA: >=500 mg/dL — AB
Hgb urine dipstick: NEGATIVE
Ketones, ur: 20 mg/dL — AB
Nitrite: POSITIVE — AB
Protein, ur: 30 mg/dL — AB
Specific Gravity, Urine: >1.046 — ABNORMAL HIGH (ref 1.005–1.030)
pH: 6 (ref 5.0–8.0)

## 2021-11-13 LAB — COMPREHENSIVE METABOLIC PANEL
ALT: 28 U/L (ref 0–44)
AST: 15 U/L (ref 15–41)
Albumin: 2.8 g/dL — ABNORMAL LOW (ref 3.5–5.0)
Alkaline Phosphatase: 120 U/L (ref 38–126)
Anion gap: 12 (ref 5–15)
BUN: 9 mg/dL (ref 6–20)
CO2: 17 mmol/L — ABNORMAL LOW (ref 22–32)
Calcium: 8.5 mg/dL — ABNORMAL LOW (ref 8.9–10.3)
Chloride: 100 mmol/L (ref 98–111)
Creatinine, Ser: 0.4 mg/dL — ABNORMAL LOW (ref 0.44–1.00)
GFR, Estimated: >60 mL/min (ref >60)
Glucose, Bld: 331 mg/dL — ABNORMAL HIGH (ref 70–99)
Potassium: 3.9 mmol/L (ref 3.5–5.1)
Sodium: 129 mmol/L — ABNORMAL LOW (ref 135–145)
Total Bilirubin: 1 mg/dL (ref 0.3–1.2)
Total Protein: 6.9 g/dL (ref 6.5–8.1)

## 2021-11-13 LAB — CBC
HCT: 36.7 % (ref 36.0–46.0)
Hemoglobin: 12.4 g/dL (ref 12.0–15.0)
MCH: 28.7 pg (ref 26.0–34.0)
MCHC: 33.8 g/dL (ref 30.0–36.0)
MCV: 85 fL (ref 80.0–100.0)
Platelets: 318 10*3/uL (ref 150–400)
RBC: 4.32 MIL/uL (ref 3.87–5.11)
RDW: 13.2 % (ref 11.5–15.5)
WBC: 16.7 10*3/uL — ABNORMAL HIGH (ref 4.0–10.5)
nRBC: 0 % (ref 0.0–0.2)

## 2021-11-13 MED ORDER — ONDANSETRON HCL 4 MG/2ML IJ SOLN
4.0000 mg | Freq: Once | INTRAMUSCULAR | Status: AC
  Administered 2021-11-13: 4 mg via INTRAVENOUS
  Filled 2021-11-13: qty 2

## 2021-11-13 MED ORDER — SODIUM CHLORIDE 0.9 % IV BOLUS
1000.0000 mL | Freq: Once | INTRAVENOUS | Status: AC
  Administered 2021-11-13: 1000 mL via INTRAVENOUS

## 2021-11-13 MED ORDER — ACETAMINOPHEN 325 MG PO TABS
650.0000 mg | ORAL_TABLET | Freq: Once | ORAL | Status: AC
  Administered 2021-11-13: 650 mg via ORAL
  Filled 2021-11-13: qty 2

## 2021-11-13 MED ORDER — ONDANSETRON HCL 4 MG/2ML IJ SOLN
4.0000 mg | Freq: Three times a day (TID) | INTRAMUSCULAR | Status: DC | PRN
  Administered 2021-11-13: 4 mg via INTRAVENOUS
  Filled 2021-11-13: qty 2

## 2021-11-13 MED ORDER — IOHEXOL 300 MG/ML  SOLN
100.0000 mL | Freq: Once | INTRAMUSCULAR | Status: AC | PRN
  Administered 2021-11-13: 100 mL via INTRAVENOUS

## 2021-11-13 MED ORDER — MORPHINE SULFATE (PF) 4 MG/ML IV SOLN
4.0000 mg | Freq: Once | INTRAVENOUS | Status: AC
  Administered 2021-11-13: 4 mg via INTRAVENOUS
  Filled 2021-11-13: qty 1

## 2021-11-13 MED ORDER — PIPERACILLIN-TAZOBACTAM 3.375 G IVPB 30 MIN
3.3750 g | Freq: Once | INTRAVENOUS | Status: AC
  Administered 2021-11-13: 3.375 g via INTRAVENOUS
  Filled 2021-11-13: qty 50

## 2021-11-13 MED ORDER — HYDROMORPHONE HCL 1 MG/ML IJ SOLN
0.5000 mg | INTRAMUSCULAR | Status: DC | PRN
  Administered 2021-11-13: 0.5 mg via INTRAVENOUS
  Filled 2021-11-13: qty 0.5

## 2021-11-13 NOTE — ED Provider Notes (Addendum)
[facility] Provider Note    Event Date/Time   First MD Initiated Contact with Patient 11/13/21 1254     (approximate)  History   Chief Complaint: Headache and Back Pain  HPI  Rachel Parker is a 36 y.o. female 2 weeks status post C-section presents to the emergency department for lower abdominal pain headache and chills.  According to the patient for the past several days she has been experiencing lower abdominal pain that has progressively worsened and now she is experiencing pain in her lower back as well.  Patient is 2 weeks status post C-section denies any discharge or bleeding from the C-section incision.  States minimal vaginal bleeding.  Denies any dysuria.  Denies any vomiting or diarrhea.  Patient states she was prescribed ibuprofen and oxycodone after her C-section, she has since run out of the oxycodone as of yesterday.  Patient states chills but has not measured a temperature.  Patient is not breast-feeding with no plans to breast-feed.  Physical Exam   Triage Vital Signs: ED Triage Vitals  Enc Vitals Group     BP 11/13/21 1134 137/82     Pulse Rate 11/13/21 1134 (!) 140     Resp 11/13/21 1134 18     Temp 11/13/21 1134 99.1 F (37.3 C)     Temp Source 11/13/21 1134 Oral     SpO2 11/13/21 1134 99 %     Weight 11/13/21 1135 200 lb (90.7 kg)     Height 11/13/21 1135 5\' 6"  (1.676 m)     Head Circumference --      Peak Flow --      Pain Score 11/13/21 1135 10     Pain Loc --      Pain Edu? --      Excl. in GC? --     Most recent vital signs: Vitals:   11/13/21 1134  BP: 137/82  Pulse: (!) 140  Resp: 18  Temp: 99.1 F (37.3 C)  SpO2: 99%    General: Awake, no distress.  CV:  Good peripheral perfusion.  Regular rate and rhythm  Resp:  Normal effort.  Equal breath sounds bilaterally.  Abd:  Patient has moderate lower abdominal tenderness without rebound or guarding without focal area of tenderness identified..  Well-appearing surgical  incision without dehiscence, no bleeding or discharge from the incision.   ED Results / Procedures / Treatments   EKG  EKG viewed and interpreted by myself shows sinus tachycardia 135 bpm with a narrow QRS, normal axis, normal intervals, nonspecific ST changes.  RADIOLOGY  I have reviewed and interpreted the CT images.  Patient appears to have a postsurgical abscess beneath the C-section incision. Radiology confirms large anterior lower abdominal wall gas and fluid collection likely representing postsurgical abscess.   MEDICATIONS ORDERED IN ED: Medications  morphine (PF) 4 MG/ML injection 4 mg (has no administration in time range)  ondansetron (ZOFRAN) injection 4 mg (has no administration in time range)  sodium chloride 0.9 % bolus 1,000 mL (has no administration in time range)     IMPRESSION / MDM / ASSESSMENT AND PLAN / ED COURSE  I reviewed the triage vital signs and the nursing notes.  Patient's presentation is most consistent with acute presentation with potential threat to life or bodily function.  Patient presents to the emergency department 2-week status post C-section with worsening lower abdominal pain over the past 3 to 4 days.  Moderate tenderness across the lower abdomen on exam.  Patient denies vaginal discharge she has minimal vaginal bleeding.  Denies urinary symptoms.  C-section incision appears well.  We will check labs we will obtain CT imaging to evaluate for any intra-abdominal bleeding, hematoma, abscess.  We will obtain a urine sample to rule out urinary tract infection.  Differential would include UTI, intra-abdominal abscess/post surgical abscess, hematoma, endometritis.  Lab work shows a moderate leukocytosis of 16,000, normal H&H.  Patient's chemistry shows slight hyponatremia no other significant findings/slight level to blood glucose however the patient is currently drinking a [employer].  Patient receiving IV fluids.  CT scan is pending.  Patient care  signed out to oncoming provider.  CT scan shows large anterior abdominal wall gas and fluid collection consistent with a postsurgical abscess from the patient's C-section.  Given the patient's tachycardia and elevated white blood cell count we will cover with antibiotics.  We will discuss with [facility] who performed the C-section for possible transfer as the patient likely will require drainage plus or minus washout.  Started IV Zosyn.  Patient states she is feeling better after pain medication.  Patient has been accepted by Dr. Ysidro Evert to OB/GYN at [facility] for further evaluation and treatment.  Patient agreeable to plan.  Awaiting transport.  FINAL CLINICAL IMPRESSION(S) / ED DIAGNOSES   Lower abdominal pain Postsurgical abscess   Note:  This document was prepared using Dragon voice recognition software and may include unintentional dictation errors.   Harvest Dark, MD 11/13/21 1358    Harvest Dark, MD 11/13/21 [phone]

## 2021-11-13 NOTE — ED Triage Notes (Signed)
Pt to ED via POV from home. Pt reports had a emergency c-section 2 weeks ago. Pt reports HA, lower back pain and chills x3 days. Pt reports no urinary symptoms or issues with c-section scar.

## 2021-11-13 NOTE — ED Provider Notes (Signed)
Nurse contacted me stating patient would like to be transported via private vehicle. Discussed with patient. Discussed concern for worsening illness outside of hospital setting. At this time however I do think it is a reasonable option given stability here.    Phineas Semen, MD 11/13/21 2022

## 2022-09-12 IMAGING — US US OB < 14 WEEKS - US OB TV
1 series · 14 of 28 positions shown · non-contrast
Comparison: None.

CLINICAL DATA: Vaginal bleeding. Estimated gestational age of 9
weeks, 4 days by LMP.

EXAM:
OBSTETRIC <14 WK US AND TRANSVAGINAL OB US
TECHNIQUE: Both transabdominal and transvaginal ultrasound examinations were
performed for complete evaluation of the gestation as well as the
maternal uterus, adnexal regions, and pelvic cul-de-sac.
Transvaginal technique was performed to assess early pregnancy.

[Series 1: us ob less than 14 weeks with ob transvaginal · 14 of 145 slices shown]
[im 6/145]
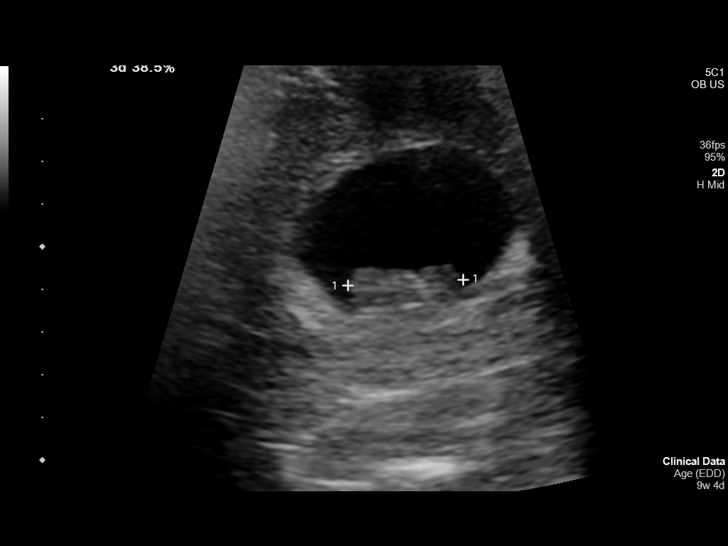
[im 17/145]
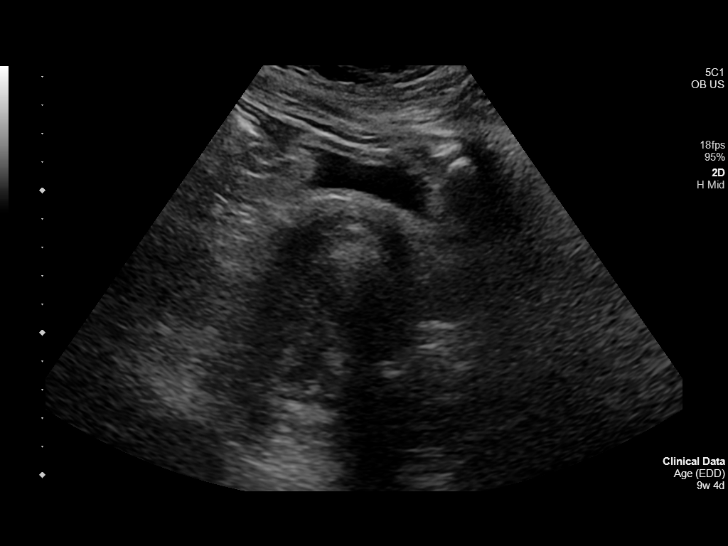
[im 27/145]
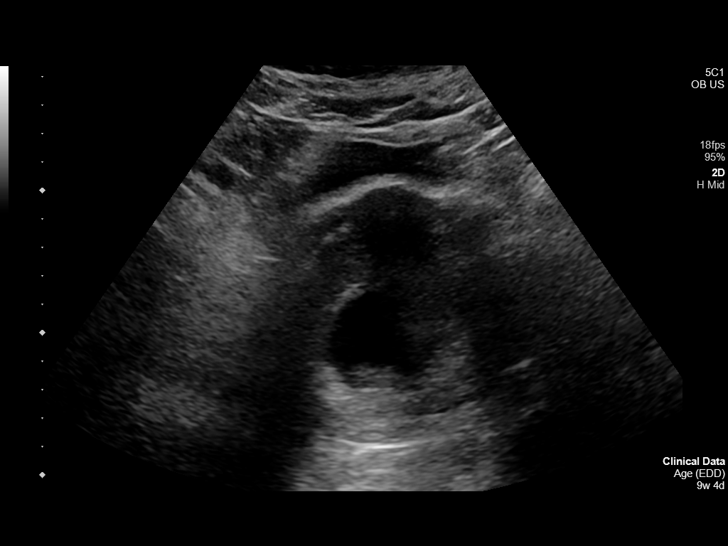
[im 38/145]
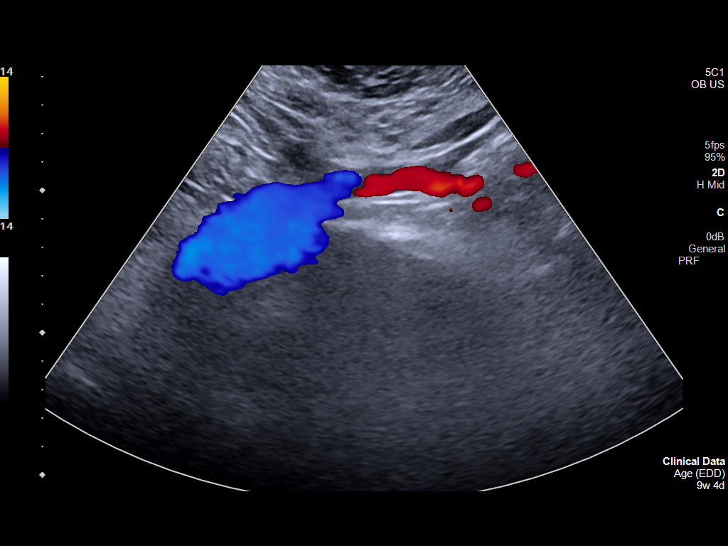
[im 49/145]
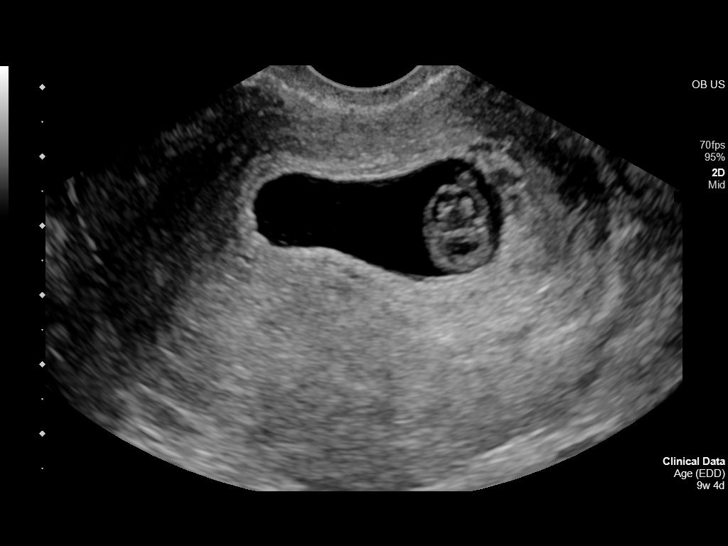
[im 59/145]
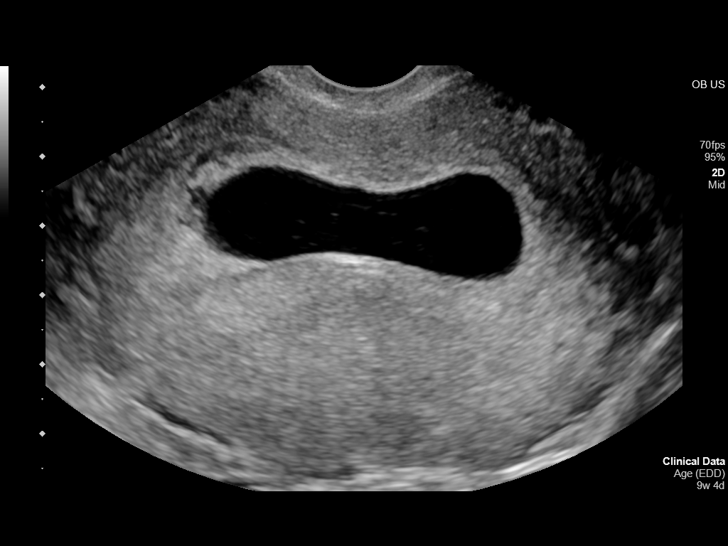
[im 70/145]
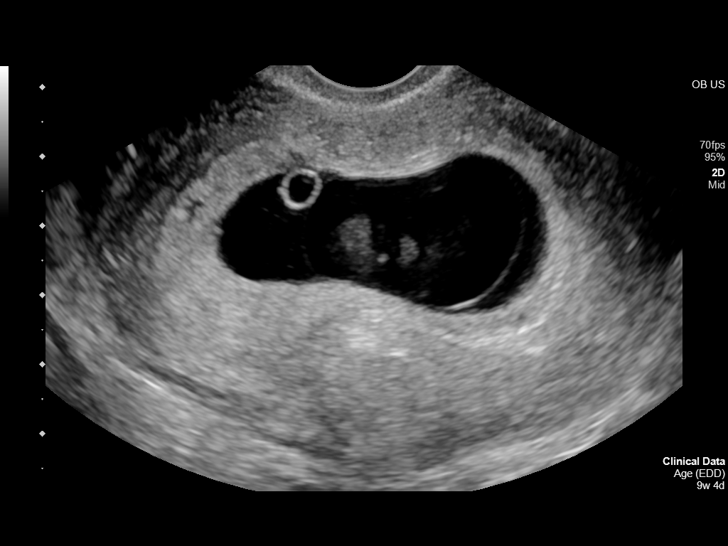
[im 81/145]
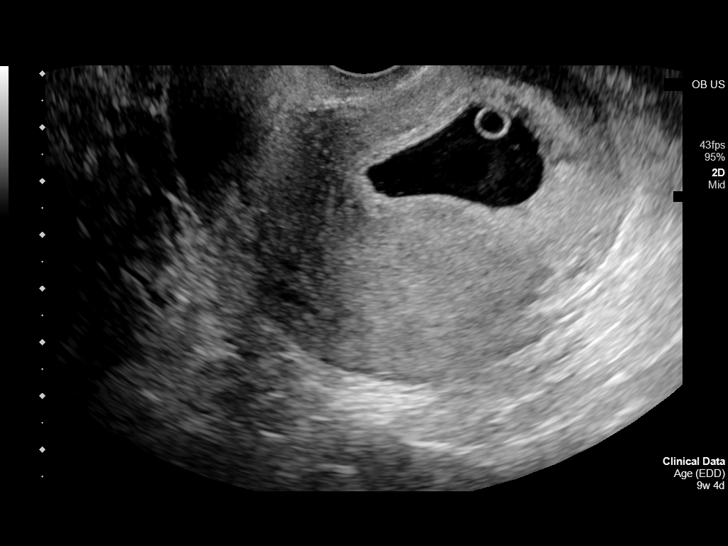
[im 91/145]
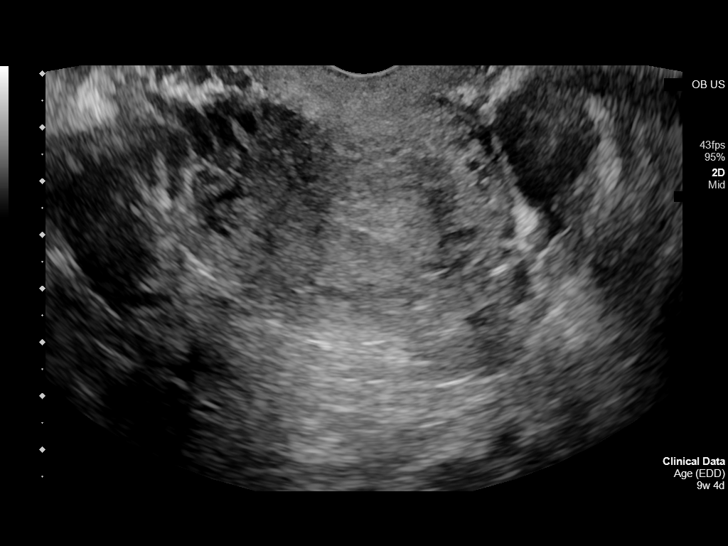
[im 102/145]
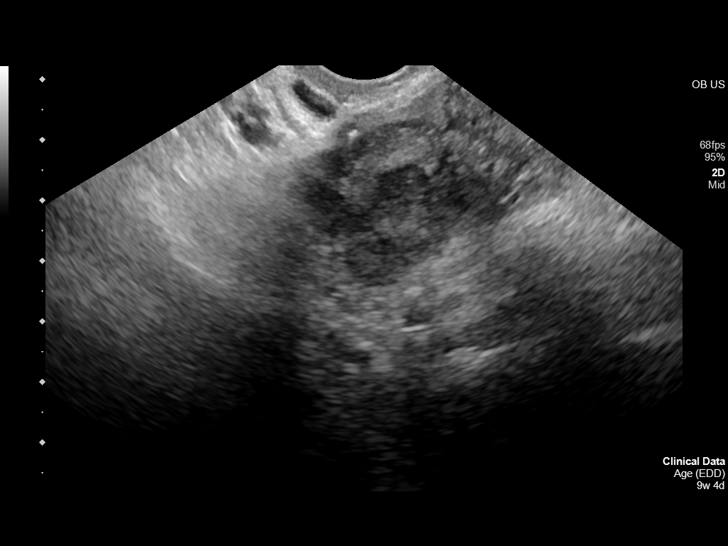
[im 113/145]
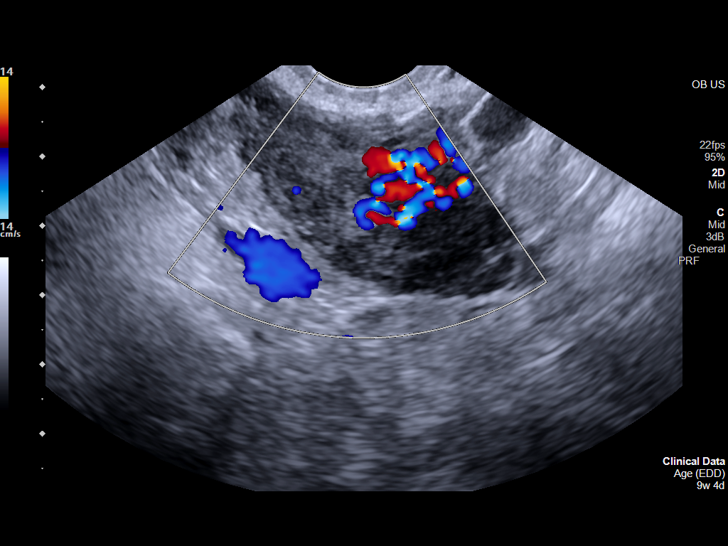
[im 123/145]
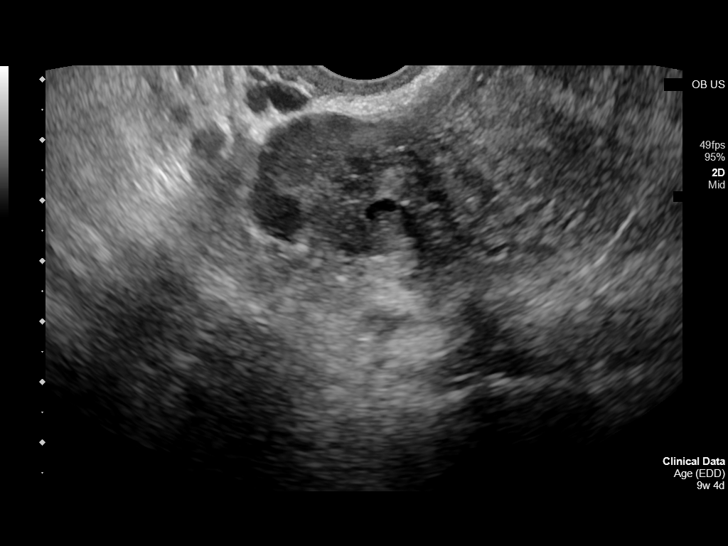
[im 134/145]
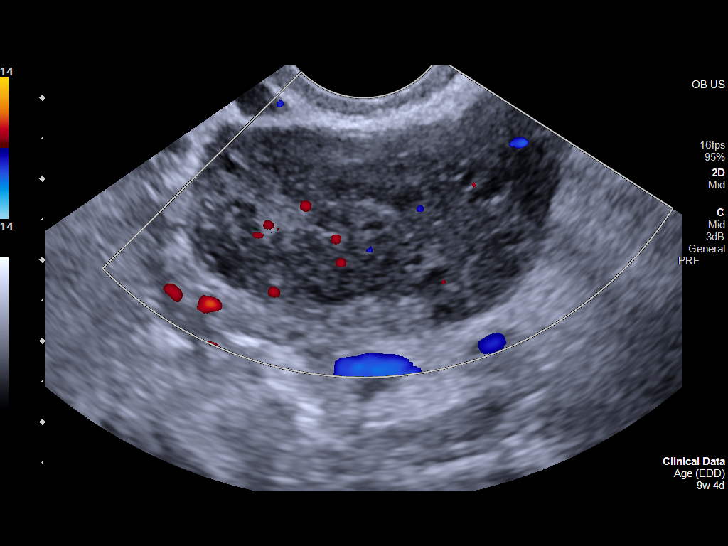
[im 145/145]
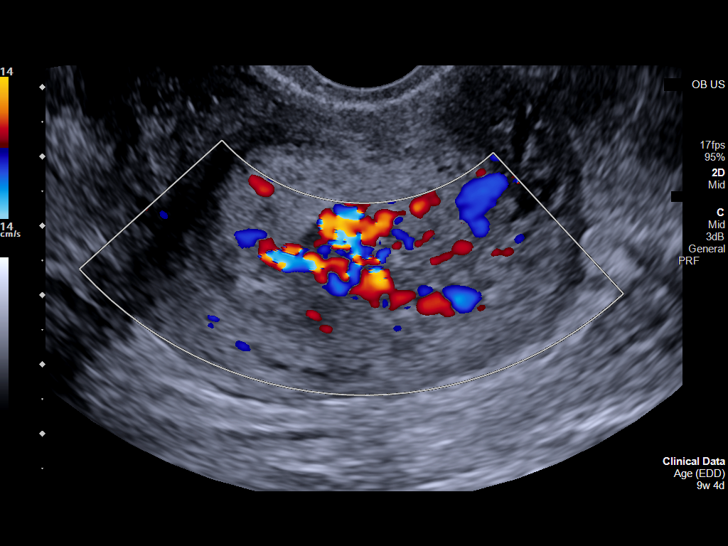

[14 of 28 positions shown; findings below may reference images not displayed]

FINDINGS: Intrauterine gestational sac: Single.

Yolk sac:  Visualized.

Embryo:  Visualized.

Cardiac Activity: Visualized.

Heart Rate: 180 bpm

CRL:  24.3 mm   9 w   1 d                  US EDC: 11/16/2021

Subchorionic hemorrhage:  Small subchorionic hemorrhage.

Maternal uterus/adnexae: Unremarkable.  Right ovarian corpus luteum.
IMPRESSION: 1. Single live intrauterine pregnancy with estimated gestational age
of 9 weeks, 1 day.
2. Borderline fetal tachycardia.
3. Small subchorionic hemorrhage.

## 2022-11-30 ENCOUNTER — Ambulatory Visit: Payer: PRIVATE HEALTH INSURANCE

## 2022-12-16 ENCOUNTER — Ambulatory Visit: Payer: PRIVATE HEALTH INSURANCE

## 2022-12-17 ENCOUNTER — Encounter: Payer: Self-pay | Admitting: Family Medicine

## 2022-12-17 ENCOUNTER — Ambulatory Visit: Payer: PRIVATE HEALTH INSURANCE | Admitting: Family Medicine

## 2022-12-17 DIAGNOSIS — Z8619 Personal history of other infectious and parasitic diseases: Secondary | ICD-10-CM

## 2022-12-17 DIAGNOSIS — Z113 Encounter for screening for infections with a predominantly sexual mode of transmission: Secondary | ICD-10-CM

## 2022-12-17 LAB — HM HIV SCREENING LAB: HM HIV Screening: NEGATIVE

## 2022-12-17 NOTE — Progress Notes (Signed)
Pt is here for Syphilis testing.  Condoms declined.  Berdie Ogren, RN
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
## 2023-03-01 ENCOUNTER — Other Ambulatory Visit: Payer: Self-pay

## 2023-03-01 ENCOUNTER — Encounter: Payer: Self-pay | Admitting: *Deleted

## 2023-03-01 DIAGNOSIS — R509 Fever, unspecified: Secondary | ICD-10-CM | POA: Diagnosis present

## 2023-03-01 DIAGNOSIS — M791 Myalgia, unspecified site: Secondary | ICD-10-CM | POA: Insufficient documentation

## 2023-03-01 DIAGNOSIS — Z5321 Procedure and treatment not carried out due to patient leaving prior to being seen by health care provider: Secondary | ICD-10-CM | POA: Insufficient documentation

## 2023-03-01 LAB — RESP PANEL BY RT-PCR (RSV, FLU A&B, COVID)  RVPGX2
Influenza A by PCR: NEGATIVE
Influenza B by PCR: NEGATIVE
Resp Syncytial Virus by PCR: NEGATIVE
SARS Coronavirus 2 by RT PCR: NEGATIVE

## 2023-03-01 NOTE — ED Triage Notes (Signed)
 Pt reports flu like sx with bodyaches, chills fever, recent finger infection. Pt was taking abx for finger infection.  Pt alert.

## 2023-03-02 ENCOUNTER — Emergency Department
Admission: EM | Admit: 2023-03-02 | Discharge: 2023-03-02 | Discharge status: Against Medical Advice | Payer: 59 | Attending: Emergency Medicine | Admitting: Emergency Medicine

## 2023-03-02 NOTE — ED Notes (Signed)
 No answer when called several times from lobby
